# Patient Record
Sex: Male | Born: 1955 | Race: White | Hispanic: No | Marital: Single | Smoking: Former smoker
Health system: Southern US, Community
[De-identification: ages and names within clinical notes are randomized; demographics above are authoritative.]

## PROBLEM LIST (undated history)

## (undated) DIAGNOSIS — I219 Acute myocardial infarction, unspecified: Secondary | ICD-10-CM

## (undated) DIAGNOSIS — I10 Essential (primary) hypertension: Secondary | ICD-10-CM

## (undated) DIAGNOSIS — N400 Enlarged prostate without lower urinary tract symptoms: Secondary | ICD-10-CM

## (undated) DIAGNOSIS — E538 Deficiency of other specified B group vitamins: Secondary | ICD-10-CM

## (undated) DIAGNOSIS — E119 Type 2 diabetes mellitus without complications: Secondary | ICD-10-CM

## (undated) DIAGNOSIS — E785 Hyperlipidemia, unspecified: Secondary | ICD-10-CM

## (undated) DIAGNOSIS — I2510 Atherosclerotic heart disease of native coronary artery without angina pectoris: Secondary | ICD-10-CM

## (undated) HISTORY — PX: LEG SURGERY: SHX1003

## (undated) HISTORY — PX: SHOULDER SURGERY: SHX246

## (undated) HISTORY — DX: Essential (primary) hypertension: I10

## (undated) HISTORY — DX: Deficiency of other specified B group vitamins: E53.8

## (undated) HISTORY — DX: Hyperlipidemia, unspecified: E78.5

## (undated) HISTORY — DX: Atherosclerotic heart disease of native coronary artery without angina pectoris: I25.10

## (undated) HISTORY — DX: Benign prostatic hyperplasia without lower urinary tract symptoms: N40.0

## (undated) HISTORY — DX: Acute myocardial infarction, unspecified: I21.9

---

## 2002-08-29 HISTORY — PX: CORONARY STENT PLACEMENT: SHX1402

## 2002-09-18 ENCOUNTER — Inpatient Hospital Stay (HOSPITAL_COMMUNITY): Admission: EM | Admit: 2002-09-18 | Discharge: 2002-09-21 | Payer: Self-pay | Admitting: *Deleted

## 2002-09-18 ENCOUNTER — Encounter: Payer: Self-pay | Admitting: *Deleted

## 2004-08-29 ENCOUNTER — Ambulatory Visit: Payer: Self-pay | Admitting: Cardiology

## 2004-09-05 ENCOUNTER — Ambulatory Visit: Payer: Self-pay | Admitting: Cardiology

## 2004-09-05 ENCOUNTER — Ambulatory Visit: Payer: Self-pay

## 2004-10-26 ENCOUNTER — Ambulatory Visit: Payer: Self-pay | Admitting: Cardiology

## 2005-04-22 ENCOUNTER — Ambulatory Visit: Payer: Self-pay | Admitting: Cardiology

## 2005-04-29 ENCOUNTER — Ambulatory Visit: Payer: Self-pay | Admitting: Cardiology

## 2005-06-03 ENCOUNTER — Ambulatory Visit: Payer: Self-pay | Admitting: Cardiology

## 2005-08-14 ENCOUNTER — Ambulatory Visit: Payer: Self-pay | Admitting: Cardiology

## 2005-10-04 ENCOUNTER — Ambulatory Visit: Payer: Self-pay | Admitting: Cardiology

## 2006-06-04 ENCOUNTER — Ambulatory Visit: Payer: Self-pay | Admitting: Cardiology

## 2006-06-04 LAB — CONVERTED CEMR LAB
AST: 28 units/L (ref 0–37)
BUN: 15 mg/dL (ref 6–23)
Bilirubin, Direct: 0.2 mg/dL (ref 0.0–0.3)
CO2: 33 meq/L — ABNORMAL HIGH (ref 19–32)
Chloride: 105 meq/L (ref 96–112)
Cholesterol: 152 mg/dL (ref 0–200)
GFR calc Af Amer: 152 mL/min
GFR calc non Af Amer: 126 mL/min
HDL: 35.4 mg/dL — ABNORMAL LOW (ref >39.0)
Potassium: 3.7 meq/L (ref 3.5–5.1)
Sodium: 141 meq/L (ref 135–145)
VLDL: 16 mg/dL (ref 0–40)

## 2006-06-13 ENCOUNTER — Ambulatory Visit: Payer: Self-pay

## 2006-07-30 ENCOUNTER — Ambulatory Visit: Payer: Self-pay | Admitting: Cardiology

## 2006-07-30 LAB — CONVERTED CEMR LAB
ALT: 29 units/L (ref 0–53)
AST: 26 units/L (ref 0–37)
Bilirubin, Direct: 0.2 mg/dL (ref 0.0–0.3)
LDL Cholesterol: 87 mg/dL (ref 0–99)
Total Bilirubin: 1.2 mg/dL (ref 0.3–1.2)
Total Protein: 6.8 g/dL (ref 6.0–8.3)
Triglycerides: 97 mg/dL (ref 0–149)
VLDL: 19 mg/dL (ref 0–40)

## 2007-03-10 ENCOUNTER — Ambulatory Visit: Payer: Self-pay | Admitting: Cardiology

## 2007-03-12 ENCOUNTER — Ambulatory Visit: Payer: Self-pay | Admitting: Cardiology

## 2007-03-12 LAB — CONVERTED CEMR LAB
ALT: 37 units/L (ref 0–53)
Albumin: 3.8 g/dL (ref 3.5–5.2)
Alkaline Phosphatase: 101 units/L (ref 39–117)
BUN: 22 mg/dL (ref 6–23)
Bilirubin, Direct: 0.2 mg/dL (ref 0.0–0.3)
CO2: 31 meq/L (ref 19–32)
Calcium: 9.3 mg/dL (ref 8.4–10.5)
Cholesterol: 142 mg/dL (ref 0–200)
GFR calc Af Amer: 131 mL/min
GFR calc non Af Amer: 108 mL/min
LDL Cholesterol: 91 mg/dL (ref 0–99)
Sodium: 138 meq/L (ref 135–145)
Total CHOL/HDL Ratio: 5
Total Protein: 7 g/dL (ref 6.0–8.3)
VLDL: 22 mg/dL (ref 0–40)

## 2007-04-27 ENCOUNTER — Ambulatory Visit: Payer: Self-pay | Admitting: Cardiology

## 2007-04-27 LAB — CONVERTED CEMR LAB
Albumin: 3.7 g/dL (ref 3.5–5.2)
Alkaline Phosphatase: 110 units/L (ref 39–117)
Bilirubin, Direct: 0.2 mg/dL (ref 0.0–0.3)
HDL: 25.8 mg/dL — ABNORMAL LOW (ref >39.0)
Total Protein: 7.3 g/dL (ref 6.0–8.3)
VLDL: 17 mg/dL (ref 0–40)

## 2008-06-02 ENCOUNTER — Ambulatory Visit: Payer: Self-pay | Admitting: Cardiology

## 2008-06-02 DIAGNOSIS — R55 Syncope and collapse: Secondary | ICD-10-CM

## 2008-06-02 DIAGNOSIS — I2510 Atherosclerotic heart disease of native coronary artery without angina pectoris: Secondary | ICD-10-CM | POA: Insufficient documentation

## 2008-06-02 DIAGNOSIS — I10 Essential (primary) hypertension: Secondary | ICD-10-CM

## 2008-06-02 DIAGNOSIS — E782 Mixed hyperlipidemia: Secondary | ICD-10-CM | POA: Insufficient documentation

## 2008-06-03 LAB — CONVERTED CEMR LAB
ALT: 27 units/L (ref 0–53)
AST: 22 units/L (ref 0–37)
BUN: 23 mg/dL (ref 6–23)
Bilirubin, Direct: 0.2 mg/dL (ref 0.0–0.3)
Calcium: 9.2 mg/dL (ref 8.4–10.5)
Chloride: 106 meq/L (ref 96–112)
Creatinine, Ser: 0.6 mg/dL (ref 0.4–1.5)
Glucose, Bld: 159 mg/dL — ABNORMAL HIGH (ref 70–99)
HDL: 29.3 mg/dL — ABNORMAL LOW (ref >39.00)
LDL Cholesterol: 121 mg/dL — ABNORMAL HIGH (ref 0–99)
Potassium: 4.3 meq/L (ref 3.5–5.1)
Sodium: 142 meq/L (ref 135–145)
Total CHOL/HDL Ratio: 6
Triglycerides: 99 mg/dL (ref 0.0–149.0)

## 2008-06-13 ENCOUNTER — Telehealth (INDEPENDENT_AMBULATORY_CARE_PROVIDER_SITE_OTHER): Payer: Self-pay | Admitting: *Deleted

## 2008-06-14 ENCOUNTER — Encounter: Payer: Self-pay | Admitting: Cardiology

## 2008-06-14 ENCOUNTER — Ambulatory Visit: Payer: Self-pay

## 2008-06-15 ENCOUNTER — Encounter: Payer: Self-pay | Admitting: Cardiology

## 2008-07-15 ENCOUNTER — Encounter (INDEPENDENT_AMBULATORY_CARE_PROVIDER_SITE_OTHER): Payer: Self-pay | Admitting: *Deleted

## 2008-07-15 ENCOUNTER — Ambulatory Visit: Payer: Self-pay | Admitting: Cardiology

## 2008-07-15 LAB — CONVERTED CEMR LAB
AST: 27 units/L (ref 0–37)
Alkaline Phosphatase: 90 units/L (ref 39–117)
Bilirubin, Direct: 0.4 mg/dL — ABNORMAL HIGH (ref 0.0–0.3)
Cholesterol: 104 mg/dL (ref 0–200)
Total Bilirubin: 1.9 mg/dL — ABNORMAL HIGH (ref 0.3–1.2)
VLDL: 17.8 mg/dL (ref 0.0–40.0)

## 2009-04-11 ENCOUNTER — Inpatient Hospital Stay (HOSPITAL_COMMUNITY): Admission: EM | Admit: 2009-04-11 | Discharge: 2009-04-14 | Payer: Self-pay | Admitting: Emergency Medicine

## 2009-04-12 ENCOUNTER — Ambulatory Visit: Payer: Self-pay | Admitting: Internal Medicine

## 2009-04-13 ENCOUNTER — Encounter (INDEPENDENT_AMBULATORY_CARE_PROVIDER_SITE_OTHER): Payer: Self-pay

## 2009-04-13 ENCOUNTER — Encounter: Payer: Self-pay | Admitting: Internal Medicine

## 2009-04-14 ENCOUNTER — Encounter (INDEPENDENT_AMBULATORY_CARE_PROVIDER_SITE_OTHER): Payer: Self-pay | Admitting: *Deleted

## 2009-04-17 ENCOUNTER — Telehealth: Payer: Self-pay | Admitting: Internal Medicine

## 2009-04-17 ENCOUNTER — Ambulatory Visit: Payer: Self-pay | Admitting: Internal Medicine

## 2009-04-24 ENCOUNTER — Telehealth: Payer: Self-pay | Admitting: Internal Medicine

## 2009-04-28 ENCOUNTER — Ambulatory Visit: Payer: Self-pay | Admitting: Internal Medicine

## 2009-04-28 DIAGNOSIS — D510 Vitamin B12 deficiency anemia due to intrinsic factor deficiency: Secondary | ICD-10-CM | POA: Insufficient documentation

## 2009-05-01 LAB — CONVERTED CEMR LAB
Basophils Relative: 1.2 % (ref 0.0–3.0)
Eosinophils Relative: 1.8 % (ref 0.0–5.0)
MCV: 105.4 fL — ABNORMAL HIGH (ref 78.0–100.0)
Monocytes Absolute: 0.7 10*3/uL (ref 0.1–1.0)
Monocytes Relative: 7.9 % (ref 3.0–12.0)
Neutrophils Relative %: 66.3 % (ref 43.0–77.0)
RBC: 3.65 M/uL — ABNORMAL LOW (ref 4.22–5.81)
TSH: 2.59 microintl units/mL (ref 0.35–5.50)
Vitamin B-12: 1295 pg/mL — ABNORMAL HIGH (ref 211–911)
WBC: 8.2 10*3/uL (ref 4.5–10.5)

## 2009-06-29 ENCOUNTER — Ambulatory Visit: Payer: Self-pay | Admitting: Internal Medicine

## 2009-06-30 LAB — CONVERTED CEMR LAB
Basophils Relative: 0.8 % (ref 0.0–3.0)
Eosinophils Relative: 2 % (ref 0.0–5.0)
HCT: 45.3 % (ref 39.0–52.0)
Hemoglobin: 15.9 g/dL (ref 13.0–17.0)
Lymphocytes Relative: 23.9 % (ref 12.0–46.0)
Lymphs Abs: 2.1 10*3/uL (ref 0.7–4.0)
Monocytes Relative: 6.1 % (ref 3.0–12.0)
Neutro Abs: 5.9 10*3/uL (ref 1.4–7.7)
RBC: 5.03 M/uL (ref 4.22–5.81)
RDW: 13.5 % (ref 11.5–14.6)
WBC: 8.8 10*3/uL (ref 4.5–10.5)

## 2009-08-23 DIAGNOSIS — R7309 Other abnormal glucose: Secondary | ICD-10-CM | POA: Insufficient documentation

## 2009-08-23 DIAGNOSIS — E538 Deficiency of other specified B group vitamins: Secondary | ICD-10-CM | POA: Insufficient documentation

## 2009-08-23 DIAGNOSIS — E876 Hypokalemia: Secondary | ICD-10-CM

## 2009-08-25 ENCOUNTER — Ambulatory Visit: Payer: Self-pay | Admitting: Cardiology

## 2009-09-18 ENCOUNTER — Ambulatory Visit: Payer: Self-pay | Admitting: Cardiology

## 2009-09-20 LAB — CONVERTED CEMR LAB
ALT: 19 units/L (ref 0–53)
BUN: 14 mg/dL (ref 6–23)
Bilirubin, Direct: 0.2 mg/dL (ref 0.0–0.3)
Chloride: 107 meq/L (ref 96–112)
GFR calc non Af Amer: 148.44 mL/min (ref >60)
Glucose, Bld: 300 mg/dL — ABNORMAL HIGH (ref 70–99)
HDL: 27.2 mg/dL — ABNORMAL LOW (ref >39.00)
LDL Cholesterol: 85 mg/dL (ref 0–99)
Potassium: 4.8 meq/L (ref 3.5–5.1)
Sodium: 143 meq/L (ref 135–145)
Total Bilirubin: 0.9 mg/dL (ref 0.3–1.2)
VLDL: 33.6 mg/dL (ref 0.0–40.0)

## 2009-09-25 ENCOUNTER — Ambulatory Visit: Payer: Self-pay | Admitting: Cardiology

## 2009-09-29 LAB — CONVERTED CEMR LAB
ALT: 22 units/L (ref 0–53)
AST: 18 units/L (ref 0–37)
Albumin: 4 g/dL (ref 3.5–5.2)
Alkaline Phosphatase: 169 units/L — ABNORMAL HIGH (ref 39–117)
GGT: 20 units/L (ref 7–51)
Total Bilirubin: 1.1 mg/dL (ref 0.3–1.2)

## 2010-02-27 NOTE — Procedures (Signed)
Summary: Upper Endoscopy  Patient: [name] Note: All result statuses are Final unless otherwise noted.  Tests: (1) Upper Endoscopy (EGD)   EGD Upper Endoscopy       DONE     [place] [facility]     [address]     [place], [place]  [address]           ENDOSCOPY PROCEDURE REPORT           PATIENT:  Nicholas Fields, Nicholas Fields  MR#:  [id number]     BIRTHDATE:  1955-12-29, 53 yrs. old  GENDER:  male           ENDOSCOPIST:  Iva Boop, MD, [facility]     Referred by:  [facility],           PROCEDURE DATE:  04/13/2009     PROCEDURE:  EGD with biopsy     ASA CLASS:  Class III     INDICATIONS:  anemia, weight loss vitamin B12 deficiency     change in bowels (loose-diarrhea)           MEDICATIONS:   Fentanyl 50 mcg IV, Versed 5 mg IV     TOPICAL ANESTHETIC:  Cetacaine Spray           DESCRIPTION OF PROCEDURE:   After the risks benefits and     alternatives of the procedure were thoroughly explained, informed     consent was obtained.  The [id number] ([id number]) endoscope was     introduced through the mouth and advanced to the second portion of     the duodenum, without limitations.  The instrument was slowly     withdrawn as the mucosa was fully examined.     <<PROCEDUREIMAGES>>           The esophagus and gastroesophageal junction were completely normal     in appearance.  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     Multiple biopsies were obtained and sent to pathology.  Abnormal     appearing mucosa in the second portion of the duodenum. Somewhat     atrophic. Multiple biopsies were obtained and sent to pathology.     Retroflexed views revealed Retroflexion exam demonstrated findings     as previously described.    The scope was then withdrawn from the     patient and the procedure completed.           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           Iva Boop, MD, [facility]           CC:  Windle Guard, MD, The Patient           n.     eSIGNED:   Iva Boop at 04/13/2009 02:55 PM           Lethea Killings, [id number]  Note: An exclamation mark (!) indicates a result that was not dispersed into  the flowsheet. Document Creation Date: 04/13/2009 2:56 PM _______________________________________________________________________  (1) Order result status: Final Collection or observation date-time: 04/13/2009 14:15 Requested date-time:  Receipt date-time:  Reported date-time:  Referring Physician:   Ordering Physician: Stan Head [id number]) Specimen Source:  Source: Launa Grill Order Number: [id number] Lab site:

## 2010-02-27 NOTE — Progress Notes (Signed)
Summary: appt ?   Phone Note Call from Patient Call back at Home Phone [phone]   Caller: Patient Call For: Dr. Leone Payor Reason for Call: Talk to Nurse Summary of Call: pt thinks that Dr. Leone Payor wanted his hosp f/u appt sooner than 05/18/2009 Initial call taken by: Vallarie Mare,  April 24, 2009 9:19 AM  Follow-up for Phone Call        he is correct cand add him on 4/1 ?330 PM and remove other appt Iva Boop MD, [facility]  April 24, 2009 1:17 PM   Additional Follow-up for Phone Call Additional follow up Details #1::        Pt. ntfd. that appt. is changed to this Friday at 3:30pm Additional Follow-up by: Teryl Lucy RN,  April 24, 2009 2:46 PM

## 2010-02-27 NOTE — Letter (Signed)
Summary: Back to Work  [facility]  [address] [place], [place] [address]   Phone: [phone]  Fax: [phone]    April 28, 2009   Employee:  SATURNINO LIEW    To Whom It May Concern:   Mr. Aguila is cleared to return to work  without restrictions as of May 01, 2009.    If you need additional information, please feel free to contact our office.         Sincerely,    Iva Boop MD, [facility]

## 2010-02-27 NOTE — Procedures (Signed)
Summary: Colonoscopy  Patient: Nicholas Fields Note: All result statuses are Final unless otherwise noted.  Tests: (1) Colonoscopy (COL)   COL Colonoscopy           DONE     [place] [facility]     [address]     [place], [place]  [address]           COLONOSCOPY PROCEDURE REPORT           PATIENT:  Nicholas Fields, Nicholas Fields  MR#:  [id number]     BIRTHDATE:  [birth year], 53 yrs. old  GENDER:  male           ENDOSCOPIST:  Iva Boop, MD, [facility]     Referred by:  [facility],           PROCEDURE DATE:  04/13/2009     PROCEDURE:  Colonoscopy with biopsy and snare polypectomy     ASA CLASS:  Class III     INDICATIONS:  weight loss, change in bowel habits           MEDICATIONS:   There was residual sedation effect present from     prior procedure., Fentanyl 25 mcg IV, Versed 2 mg IV           DESCRIPTION OF PROCEDURE:   After the risks benefits and     alternatives of the procedure were thoroughly explained, informed     consent was obtained.  Digital rectal exam was performed and     revealed an enlarged prostate.  Diffusely enlarged. The [id number]     ([id number]) endoscope was introduced through the anus and advanced     to the terminal ileum which was intubated for a short distance,     without limitations.  The quality of the prep was adequate, using     MiraLax.  The instrument was then slowly withdrawn as the colon     was fully examined.     Insertion: 4:45 minutes Withdrawal: 15 minutes     <<PROCEDUREIMAGES>>           FINDINGS:  The terminal ileum appeared normal.  Two polyps were     found in the right colon. 3mm cecal polyp and a 4 mm ascending     polyp. The polyp was removed using cold biopsy forceps. Polyp was     snared without cautery. Retrieval was unsuccessful. snare polyp     This was otherwise a normal examination of the colon. Random     biopsies were obtained and sent to pathology.   Retroflexed views     in the rectum revealed no abnormalities.    The  scope was then     withdrawn from the patient and the procedure completed.           COMPLICATIONS:  None           ENDOSCOPIC IMPRESSION:     1) Normal terminal ileum     2) Two diminutive polyps in the right colon - removed, 1     recovered 1 not recovered     3) Otherwise normal examination - random biopsies taken           RECOMMENDATIONS:     1) Await biopsy results     see EGD report     have follow-up on enlarged prostate with PCP or GU           I will arrange outpatient follow-up with me after  discharge           REPEAT EXAM:  In for Colonoscopy, pending biopsy results.           Iva Boop, MD, [facility]           CC:  Windle Guard, MD     The Patient           n.     eSIGNED:   Iva Boop at 04/13/2009 03:03 PM           Lethea Killings, [id number]  Note: An exclamation mark (!) indicates a result that was not dispersed into the flowsheet. Document Creation Date: 04/13/2009 3:04 PM _______________________________________________________________________  (1) Order result status: Final Collection or observation date-time: 04/13/2009 14:56 Requested date-time:  Receipt date-time:  Reported date-time:  Referring Physician:   Ordering Physician: Stan Head [id number]) Specimen Source:  Source: Launa Grill Order Number: [id number] Lab site:

## 2010-02-27 NOTE — Assessment & Plan Note (Signed)
Summary: B12 - daily x5 then weekly- #1/sheri  Nurse Visit   Allergies: No Known Drug Allergies  Medication Administration  Injection # 1:    Medication: Vit B12 1000 mcg    Diagnosis: ANEMIA, VITAMIN B12 DEFICIENCY (ICD-281.1)    Route: IM    Site: R deltoid    Exp Date: 11/12    Lot #: [id number]    Mfr: [employer] Regent    Patient tolerated injection without complications    Given by: Milford Cage NCMA (April 17, 2009 2:35 PM)  Orders Added: 1)  Vit B12 1000 mcg [J3420]

## 2010-02-27 NOTE — Assessment & Plan Note (Signed)
Summary: 1 year reck/mt  Medications Added ATENOLOL 50 MG TABS (ATENOLOL) Take 1 tablet by mouth two times a day LISINOPRIL 40 MG TABS (LISINOPRIL) Take one tablet by mouth daily        CC:  check up....pt states he has not been taken any of his meds.  History of Present Illness: The patient is a very pleasant  gentleman who is status post PCI of his right coronary in August 2004.  His most recent Myoview in May 2010 showed normal perfusion.  Ejection fraction was 65%. Echocardiogram in May of 2010 showed normal LV function and a bicuspid aortic valve. There was no aortic stenosis or aortic insufficiency. Since I last saw him in May of 2010 he is doing reasonably well symptomatically. He denies any dyspnea on exertion, orthopnea, PND, chest pain or palpitations. Note he has not taken any of his medications in the past month. He has also been diagnosed with pernicious anemia.  Current Medications (verified): 1)  Atenolol 50 Mg Tabs (Atenolol) .... Take 2 Tablets By Mouth Two Times A Day 2)  Aspirin 325 Mg  Tabs (Aspirin) .... Daily 3)  Cyanocobalamin 1000 Mcg/ml Soln (Cyanocobalamin) .Marland Kitchen.. 1000 Micrograms Im Once Monthly 4)  Altace 10 Mg Caps (Ramipril) .... Take 1 Tablet By Mouth Two Times A Day 5)  Amitriptyline Hcl 25 Mg Tabs (Amitriptyline Hcl) .... Daily 6)  Hydrochlorothiazide 12.5 Mg Tabs (Hydrochlorothiazide) .... Daily 7)  Lipitor 80 Mg Tabs (Atorvastatin Calcium) .... Daily 8)  Bd Luer-Lok Syringe 25g X 1" 3 Ml Misc (Syringe/needle (Disp)) .... Use As Directed  Allergies: No Known Drug Allergies  Past History:  Past Medical History: HYPERTENSION, BENIGN MIXED HYPERLIPIDEMIA  CAD  B12 Deficiency - Pernicious anemia Status post PCI of the right coronary artery drug-eluting stent in 2004.  Past Surgical History: Reviewed history from 04/28/2009 and no changes required. Surgery for a previous leg fracture. PTCA-Stent  Social History: Reviewed history from 06/29/2009 and  no changes required. Single  [employer] Tobacco Use - Former.  Alcohol Use - no Illicit Drug Use - no  Review of Systems       no fevers or chills, productive cough, hemoptysis, dysphasia, odynophagia, melena, hematochezia, dysuria, hematuria, rash, seizure activity, orthopnea, PND, pedal edema, claudication. Remaining systems are negative.   Vital Signs:  Patient profile:   55 year old male Height:      62 inches Weight:      148 pounds BMI:     27.17 Pulse rate:   100 / minute Resp:     14 per minute BP sitting:   158 / 94  (right arm)  Vitals Entered By: Kem Parkinson (August 25, 2009 8:39 AM)  Physical Exam  General:  Well-developed well-nourished in no acute distress.  Skin is warm and dry.  HEENT is normal.  Neck is supple. No thyromegaly.  Chest is clear to auscultation with normal expansion.  Cardiovascular exam is regular rate and rhythm.  Abdominal exam nontender or distended. No masses palpated. Extremities show no edema. neuro grossly intact    EKG  Procedure date:  08/25/2009  Findings:      sinus at a rate of 99. Axis normal. Cannot rule out prior inferior infarct. Occasional PVC.  Impression & Recommendations:  Problem # 1:  HYPERTENSION, BENIGN (ICD-401.1) Patient's blood pressure is elevated but he has not taken any medications in one month. Presumed atenolol 50 mg p.o. b.i.d. Add lisinopril 40 mg p.o. daily.  Follow blood pressure at home and add additional medications as needed.Check potassium and renal function in 4 weeks. His updated medication list for this problem includes:    Atenolol 50 Mg Tabs (Atenolol) .Marland Kitchen... Take 2 tablets by mouth two times a day    Aspirin 325 Mg Tabs (Aspirin) .Marland Kitchen... Daily    Altace 10 Mg Caps (Ramipril) .Marland Kitchen... Take 1 tablet by mouth two times a day    Hydrochlorothiazide 12.5 Mg Tabs (Hydrochlorothiazide) .Marland Kitchen... Daily  Problem # 2:  MIXED HYPERLIPIDEMIA (ICD-272.2)  Resume Lipitor 80 mg p.o. daily.  Check lipids and liver in 4 weeks. His updated medication list for this problem includes:    Lipitor 80 Mg Tabs (Atorvastatin calcium) .Marland Kitchen... Daily  His updated medication list for this problem includes:    Lipitor 80 Mg Tabs (Atorvastatin calcium) .Marland Kitchen... Daily  Problem # 3:  CAD (ICD-414.00)  Continue aspirin, beta blocker, ACE inhibitor and statin. His updated medication list for this problem includes:    Atenolol 50 Mg Tabs (Atenolol) .Marland Kitchen... Take 1 tablet by mouth two times a day    Aspirin 325 Mg Tabs (Aspirin) .Marland Kitchen... Daily    Lisinopril 40 Mg Tabs (Lisinopril) .Marland Kitchen... Take one tablet by mouth daily  His updated medication list for this problem includes:    Atenolol 50 Mg Tabs (Atenolol) .Marland Kitchen... Take 1 tablet by mouth two times a day    Aspirin 325 Mg Tabs (Aspirin) .Marland Kitchen... Daily    Lisinopril 40 Mg Tabs (Lisinopril) .Marland Kitchen... Take one tablet by mouth daily  Problem # 4:  B12 DEFICIENCY (ICD-266.2)  Patient Instructions: 1)  Your physician recommends that you schedule a follow-up appointment in: ONE YEAR 2)  Your physician recommends that you return for lab work in:4 WEEKS-WEEK OF 09-18-09 3)  Your physician has recommended you make the following change in your medication: RESTART ATENOLOL 50MG  ONE TABLET TWICE DAILY 4)  RESTART LIPITOR 80MG  ONCE DAILY 5)  START LISINOPRIL 40MG  ONCE DAILY Prescriptions: LIPITOR 80 MG TABS (ATORVASTATIN CALCIUM) daily  #30 x 12   Entered by:   Deliah Goody, RN   Authorized by:   Ferman Hamming, MD, [facility]   Signed by:   Deliah Goody, RN on 08/25/2009   Method used:   Electronically to        [facility]  [address] [id number]       [address]       [place], [place]  [address]       Ph: [phone] or [phone]       Fax: [phone]   RxID:   [id number] LISINOPRIL 40 MG TABS (LISINOPRIL) Take one tablet by mouth daily  #30 x 12   Entered by:   Deliah Goody, RN   Authorized by:   Ferman Hamming, MD, [facility]    Signed by:   Deliah Goody, RN on 08/25/2009   Method used:   Electronically to        [facility]  [address] [id number]       [address]       [place], [place]  [address]       Ph: [phone] or [phone]       Fax: [phone]   RxID:   [id number] ATENOLOL 50 MG TABS (ATENOLOL) Take 1 tablet by mouth two times a day  #60 x 12   Entered by:   Deliah Goody, RN  Authorized by:   Ferman Hamming, MD, [facility]   Signed by:   Deliah Goody, RN on 08/25/2009   Method used:   [facility]ronically [facility]        [facility]  [address] [id number]       [address]       [place], [place]  [address]       Ph: [phone] or [phone]       Fax: [phone]   RxID:   [id number]

## 2010-02-27 NOTE — Letter (Signed)
Summary: New Patient letter  [facility]  [address] [place], [place] [address]   Phone: [phone]  Fax: [phone]       04/14/2009 MRN: [id number]  Nicholas Fields [address] [place], [place]  [address]  Dear Nicholas Fields,  Welcome to the Gastroenterology Division at [facility].    You are scheduled to see Dr.  Leone Payor on 05-18-09 at 8:45a.m. on the 3rd floor at [facility], [address]  We ask that you try to arrive at our office 15 minutes prior to your appointment time to allow for check-in.  We would like you to complete the enclosed self-administered evaluation form prior to your visit and bring it with you on the day of your appointment.  We will review it with you.  Also, please bring a complete list of all your medications or, if you prefer, bring the medication bottles and we will list them.  Please bring your insurance card so that we may make a copy of it.  If your insurance requires a referral to see a specialist, please bring your referral form from your primary care physician.  Co-payments are due at the time of your visit and may be paid by cash, check or credit card.     Your office visit will consist of a consult with your physician (includes a physical exam), any laboratory testing he/she may order, scheduling of any necessary diagnostic testing (e.g. x-ray, ultrasound, CT-scan), and scheduling of a procedure (e.g. Endoscopy, Colonoscopy) if required.  Please allow enough time on your schedule to allow for any/all of these possibilities.    If you cannot keep your appointment, please call [phone] to cancel or reschedule prior to your appointment date.  This allows [place] the opportunity to schedule an appointment for another patient in need of care.  If you do not cancel or reschedule by 5 p.m. the business day prior to your appointment date, you will be charged a $50.00 late cancellation/no-show fee.    Thank you for choosing  [facility] for your medical needs.  We appreciate the opportunity to care for you.  Please visit [place] at our website  to learn more about our practice.                     Sincerely,                                                             The Gastroenterology Division

## 2010-02-27 NOTE — Assessment & Plan Note (Signed)
Summary: 6 WEEK F/U..AM.    History of Present Illness Visit Type: Follow-up Visit Primary GI MD: Stan Head MD [facility] Chief Complaint: anemia f/u. History of Present Illness:   55 yo wm with pernicious anemia dianosed in Spring 2011. He has been started on B12 supplements, receiving 1000ug injection weekly (at home). He feels back to normal. Has regained some biut notall weight he lost.           Current Medications (verified): 1)  Atenolol 50 Mg Tabs (Atenolol) .... Take 2 Tablets By Mouth Two Times A Day 2)  Aspirin 325 Mg  Tabs (Aspirin) .... Daily 3)  Cyanocobalamin 1000 Mcg/ml Soln (Cyanocobalamin) .Marland Kitchen.. 1000 Micrograms Im Q Day X 4 Then Weekly X 4 Then [place]. Please Dispense #20 25 Guage 1 1/2 Inch Needles and 3 Cc Syringes 4)  Altace 10 Mg Caps (Ramipril) .... Take 1 Tablet By Mouth Two Times A Day 5)  Amitriptyline Hcl 25 Mg Tabs (Amitriptyline Hcl) .... Daily 6)  Hydrochlorothiazide 12.5 Mg Tabs (Hydrochlorothiazide) .... Daily 7)  Lipitor 80 Mg Tabs (Atorvastatin Calcium) .... Daily 8)  Bd Luer-Lok Syringe 25g X 1" 3 Ml Misc (Syringe/needle (Disp)) .... Use As Directed  Allergies (verified): No Known Drug Allergies  Past History:  Past Medical History: Reviewed history from 04/28/2009 and no changes required. Coronary artery disease Hypertension Hyperlipidemia Status post PCI of the right coronary artery drug-eluting stent in 2004. B12 Deficiency - Pernicious anemia  Past Surgical History: Reviewed history from 04/28/2009 and no changes required. Surgery for a previous leg fracture. PTCA-Stent  Family History: Reviewed history from 04/28/2009 and no changes required. Mother with history of coronary artery disease. No FH of Colon Cancer:  Social History: Single  [employer] Tobacco Use - Former.  Alcohol Use - no Illicit Drug Use - no  Vital Signs:  Patient profile:   55 year old male Height:      62.5 inches Weight:      148.38  pounds BMI:     26.80 Pulse rate:   76 / minute Pulse rhythm:   regular BP sitting:   158 / 80  (left arm)  Vitals Entered By: Milford Cage NCMA (June 29, 2009 4:09 PM)  Physical Exam  General:  Well developed, well nourished, no acute distress.   Impression & Recommendations:  Problem # 1:  PERNICIOUS ANEMIA (ICD-281.0) Assessment Improved He is much betternow on B12 supplements. + anti-parietal cell and intrinsic factor antibosies. celiac testing and biospies were negative. Orders: TLB-CBC Platelet - w/Differential ([id number])  Plan for him to follow-up with Dr. Jeannetta Nap and receive B12 injection refills through that office in the future and see me as needed  Patient Instructions: 1)  Please go to the basement to have your lab tests drawn today. 2)  Take the vitamin B12 1 time a month. Do not change unless your physician says to. 3)  Follow-up with Dr. Jeannetta Nap in the future for general care and B12 prescriptions. 4)  see Gr. Leone Payor as needed. 5)  Copy sent to : Windle Guard, MD 6)  The medication list was reviewed and reconciled.  All changed / newly prescribed medications were explained.  A complete medication list was provided to the patient / caregiver.  Patient: Nicholas Fields Note: All result statuses are Final unless otherwise noted.  Tests: (1) CBC Platelet w/Diff (CBCD)   White Cell Count          8.8 K/uL  4.5-10.5   Red Cell Count            5.03 Mil/uL                 4.22-5.81   Hemoglobin                15.9 g/dL                   16.1-09.6   Hematocrit                45.3 %                      39.0-52.0   MCV                       90.1 fl                     78.0-100.0   MCHC                      35.0 g/dL                   04.5-40.9   RDW                       13.5 %                      11.5-14.6   Platelet Count            197.0 K/uL                  150.0-400.0   Neutrophil %              67.2 %                      43.0-77.0    Lymphocyte %              23.9 %                      12.0-46.0   Monocyte %                6.1 %                       3.0-12.0   Eosinophils%              2.0 %                       0.0-5.0   Basophils %               0.8 %                       0.0-3.0   Neutrophill Absolute      5.9 K/uL                    1.4-7.7   Lymphocyte Absolute       2.1 K/uL                    0.7-4.0   Monocyte Absolute         0.5 K/uL  0.1-1.0  Eosinophils, Absolute                             0.2 K/uL                    0.0-0.7   Basophils Absolute        0.1 K/uL                    0.0-0.1  Note: An exclamation mark (!) indicates a result that was not dispersed into the flowsheet. Document Creation Date: 06/29/2009 5:15 PM

## 2010-02-27 NOTE — Progress Notes (Signed)
Summary: Triage  Medications Added CYANOCOBALAMIN 1000 MCG/ML SOLN (CYANOCOBALAMIN) 1000 micrograms IM q day x 4 then weekly x 4 then montly. Please dispense #20 25 guage 1 1/2 inch needles and 3 cc syringes       Phone Note Call from Patient Call back at [phone]   Caller: sister  Alona Bene    Call For: Dr. Leone Payor Reason for Call: Talk to Nurse Summary of Call: Pt has a hosp. f/u appt. on 05-18-09. Needs to sch'd B-12 injections for this week and needs to know when he should return to work. Initial call taken by: Karna Christmas,  April 17, 2009 8:49 AM  Follow-up for Phone Call        Patient  will come today at 1:40 for b12 injection #1 , please advise when he may return to work. Follow-up by: Darcey Nora RN, CGRN,  April 17, 2009 10:37 AM  Additional Follow-up for Phone Call Additional follow up Details #1::        what is his job, duties, functions, etc Iva Boop MD, [facility]  April 17, 2009 12:52 PM     Additional Follow-up for Phone Call Additional follow up Details #2::    He is a fabricator in a sheet metal shop , he works 40 hours a week.  Pleae advise when he can return to work.  his daughter is here with him today and wants to give him his injections for B12.   Follow-up by: Darcey Nora RN, CGRN,  April 17, 2009 2:10 PM  Additional Follow-up for Phone Call Additional follow up Details #3:: Details for Additional Follow-up Action Taken: he should not return to work til we reassess. I will look over his data again and make a recommendation soo re: f/u he will need to be seen sooner than 4/21 and I think perhaps extender visit next week may be way to go will let you know but communicate this to him and/or sister Additional Follow-up by: Iva Boop MD, Clementeen Graham,  April 18, 2009 7:24 AM  New/Updated Medications: CYANOCOBALAMIN 1000 MCG/ML SOLN (CYANOCOBALAMIN) 1000 micrograms IM q day x 4 then weekly x 4 then montly. Please dispense #20 25 guage 1 1/2 inch needles and 3 cc  syringes   Sister notified of Dr Marvell Fuller recommendations.  We will call her once Dr Leone Payor has had a chance to fully review   Darcey Nora RN, [facility]  April 18, 2009 8:48 AM     Prescriptions: CYANOCOBALAMIN 1000 MCG/ML SOLN (CYANOCOBALAMIN) 1000 micrograms IM q day x 4 then weekly x 4 then montly. Please dispense #20 25 guage 1 1/2 inch needles and 3 cc syringes  #20 doses x 1   Entered by:   Darcey Nora RN, CGRN   Authorized by:   Iva Boop MD, [facility]   Signed by:   Darcey Nora RN, CGRN on 04/17/2009   Method used:   Electronically to        [facility]  [address] [id number] (ret[facility])       [address]       [place], [place]  [address]       Ph: [phone] or [phone]       Fax: [phone]   RxID:   [id number]

## 2010-02-27 NOTE — Assessment & Plan Note (Signed)
Summary: f/u hosp. anemia--ch. 3:30pm appt.      Allergies Added: NKDA  History of Present Illness Visit Type: Follow-up Visit Primary GI MD: Stan Head MD [facility] Chief Complaint: follow-up anemia History of Present Illness:   55 yo white man seen last month for weight loss, bowel habit changes, anemia  and anorexia. He was found to be B12 deficient with atrophic gastitis and + anti-parietal and anti-intrinsic factor Abs. He has been getting B12 supplements daily and had first weekly B12 injection. Energy level increasing ready to go back to work getting back to normal   GI Review of Systems      Denies abdominal pain, acid reflux, belching, bloating, chest pain, dysphagia with liquids, dysphagia with solids, heartburn, loss of appetite, nausea, vomiting, vomiting blood, weight loss, and  weight gain.        Denies anal fissure, black tarry stools, change in bowel habit, constipation, diarrhea, diverticulosis, fecal incontinence, heme positive stool, hemorrhoids, irritable bowel syndrome, jaundice, light color stool, liver problems, rectal bleeding, and  rectal pain. Preventive Screening-Counseling & Management      Drug Use:  no.      Current Medications (verified): 1)  Atenolol 50 Mg Tabs (Atenolol) .... Take 2 Tablets By Mouth Two Times A Day 2)  Aspirin 325 Mg  Tabs (Aspirin) .... Daily 3)  Cyanocobalamin 1000 Mcg/ml Soln (Cyanocobalamin) .Marland Kitchen.. 1000 Micrograms Im Q Day X 4 Then Weekly X 4 Then Montly. Please Dispense #20 25 Guage 1 1/2 Inch Needles and 3 Cc Syringes 4)  Altace 10 Mg Caps (Ramipril) .... Take 1 Tablet By Mouth Two Times A Day 5)  Amitriptyline Hcl 25 Mg Tabs (Amitriptyline Hcl) .... Daily 6)  Hydrochlorothiazide 12.5 Mg Tabs (Hydrochlorothiazide) .... Daily 7)  Lipitor 80 Mg Tabs (Atorvastatin Calcium) .... Daily  Allergies (verified): No Known Drug Allergies  Past History:  Past Medical History: Coronary artery  disease Hypertension Hyperlipidemia Status post PCI of the right coronary artery drug-eluting stent in 2004. B12 Deficiency - Pernicious anemia  Past Surgical History: Reviewed history from 04/28/2009 and no changes required. Surgery for a previous leg fracture. PTCA-Stent  Family History: Reviewed history from 06/02/2008 and no changes required. Mother with history of coronary artery disease. No FH of Colon Cancer:  Social History: Reviewed history from 06/02/2008 and no changes required. Single  Tobacco Use - Former.  Alcohol Use - no Illicit Drug Use - no Drug Use:  no  Colonoscopy  Procedure date:  04/13/2009  Findings:      1) Normal terminal ileum     2) Two diminutive polyps in the right colon - removed, 1     recovered 1 not recovered benign mucosa on path     3) Otherwise normal examination - random biopsies taken  Comments:       Repeat colonoscopy in 10 years.    EGD  Procedure date:  04/13/2009  Findings:      1) Normal esophagus     2) Atrophic gastric mucosa in the total stomach - biopsied MILD GASTRITIS     3) Abnormal mucosa in the second portion duodenum NORMAL  Procedures Next Due Date:    EGD: 04/2019    Colonoscopy: 04/2019  EGD not needed, this is an error  Vital Signs:  Patient profile:   55 year old male Height:      62.5 inches Weight:      141.38 pounds BMI:     25.54 Pulse rate:   72 /  minute Pulse rhythm:   regular BP sitting:   152 / 88  (left arm)  Vitals Entered By: Milford Cage NCMA (April 28, 2009 2:58 PM)  Physical Exam  General:  Well developed, well nourished, no acute distress. Lungs:  Clear throughout to auscultation. Heart:  Regular rate and rhythm; no murmurs, rubs,  or bruits. Abdomen:  mildly distended, softNT   Impression & Recommendations:  Problem # 1:  PERNICIOUS ANEMIA (ICD-281.0) Assessment Improved He is much betternow on B12 supplements. + anti-parietal cell and intrinsic factor  antibosies. celiac testing and biospies were negative. Orders: TLB-CBC Platelet - w/Differential (85025-CBCD) TLB-B12, Serum-Total ONLY (10272-Z36) TLB-TSH (Thyroid Stimulating Hormone) (84443-TSH) 15 mins time spent with patienttoday.  Patient Instructions: 1)  Please go to the basement to have your lab tests drawn today. 2)  We are checking vitamin B12, Complete blood count and thyroid tests today. 3)  Follow-up will be arranged after lab results are reviewed. 4)  You may return to work. 5)  The medication list was reviewed and reconciled.  All changed / newly prescribed medications were explained.  A complete medication list was provided to the patient / caregiver. cc: Windle Guard, MD Patient: Lethea Killings Note: All result statuses are Final unless otherwise noted.  Tests: (1) CBC Platelet w/Diff (CBCD)   White Cell Count          8.2 K/uL                    4.5-10.5   Red Cell Count       [L]  3.65 Mil/uL                 4.22-5.81   Hemoglobin           [L]  12.8 g/dL                   64.4-03.4   Hematocrit           [L]  38.5 %                      39.0-52.0   MCV                  [H]  105.4 fl                    78.0-100.0   MCHC                      33.2 g/dL                   74.2-59.5   RDW                  [H]  23.7 %                      11.5-14.6   Platelet Count            280.0 K/uL                  150.0-400.0   Neutrophil %              66.3 %                      43.0-77.0   Lymphocyte %              22.8 %  12.0-46.0   Monocyte %                7.9 %                       3.0-12.0   Eosinophils%              1.8 %                       0.0-5.0   Basophils %               1.2 %                       0.0-3.0   Neutrophill Absolute      5.5 K/uL                    1.4-7.7   Lymphocyte Absolute       1.9 K/uL                    0.7-4.0   Monocyte Absolute         0.7 K/uL                    0.1-1.0  Eosinophils, Absolute                              0.1 K/uL                    0.0-0.7   Basophils Absolute        0.1 K/uL                    0.0-0.1  Tests: (2) B12 Serum - Total ONLY (B12)   Vitamin B12          [H]  1295 pg/mL                  211-911  Tests: (3) TSH (TSH)   FastTSH                   2.59 uIU/mL                 0.35-5.50

## 2010-04-22 LAB — GLIADIN ANTIBODIES, SERUM
Gliadin IgA: 2.8 U/mL (ref <7)
Gliadin IgG: 9.3 U/mL — ABNORMAL HIGH (ref <7)

## 2010-04-22 LAB — COMPREHENSIVE METABOLIC PANEL
AST: 24 U/L (ref 0–37)
AST: 27 U/L (ref 0–37)
Albumin: 3.7 g/dL (ref 3.5–5.2)
Albumin: 4.2 g/dL (ref 3.5–5.2)
CO2: 28 mEq/L (ref 19–32)
Calcium: 8.8 mg/dL (ref 8.4–10.5)
Creatinine, Ser: 0.53 mg/dL (ref 0.4–1.5)
GFR calc Af Amer: >60 mL/min (ref >60)
GFR calc non Af Amer: >60 mL/min (ref >60)
GFR calc non Af Amer: >60 mL/min (ref >60)
Glucose, Bld: 204 mg/dL — ABNORMAL HIGH (ref 70–99)
Potassium: 4.6 mEq/L (ref 3.5–5.1)
Sodium: 138 mEq/L (ref 135–145)
Total Bilirubin: 2.9 mg/dL — ABNORMAL HIGH (ref 0.3–1.2)
Total Protein: 5.8 g/dL — ABNORMAL LOW (ref 6.0–8.3)

## 2010-04-22 LAB — CARDIAC PANEL(CRET KIN+CKTOT+MB+TROPI)
CK, MB: 1.2 ng/mL (ref 0.3–4.0)
CK, MB: 1.5 ng/mL (ref 0.3–4.0)
Relative Index: INVALID (ref 0.0–2.5)
Relative Index: INVALID (ref 0.0–2.5)
Total CK: 18 U/L (ref 7–232)
Total CK: 24 U/L (ref 7–232)
Troponin I: <0.01 ng/mL (ref 0.00–0.06)

## 2010-04-22 LAB — RETICULIN ANTIBODIES, IGA W TITER: Reticulin Ab, IgA: NEGATIVE

## 2010-04-22 LAB — CBC
Hemoglobin: 7.5 g/dL — ABNORMAL LOW (ref 13.0–17.0)
Hemoglobin: 8.1 g/dL — ABNORMAL LOW (ref 13.0–17.0)
MCHC: 34.9 g/dL (ref 30.0–36.0)
MCV: 113.7 fL — ABNORMAL HIGH (ref 78.0–100.0)
Platelets: 148 10*3/uL — ABNORMAL LOW (ref 150–400)
Platelets: 173 10*3/uL (ref 150–400)
RBC: 1.6 MIL/uL — ABNORMAL LOW (ref 4.22–5.81)
RDW: 21.3 % — ABNORMAL HIGH (ref 11.5–15.5)
RDW: 32.6 % — ABNORMAL HIGH (ref 11.5–15.5)
RDW: 33.6 % — ABNORMAL HIGH (ref 11.5–15.5)
WBC: 6.8 10*3/uL (ref 4.0–10.5)

## 2010-04-22 LAB — FERRITIN: Ferritin: 194 ng/mL (ref 22–322)

## 2010-04-22 LAB — DIFFERENTIAL
Basophils Absolute: 0 10*3/uL (ref 0.0–0.1)
Basophils Relative: 0 % (ref 0–1)
Eosinophils Absolute: 0.1 10*3/uL (ref 0.0–0.7)
Lymphocytes Relative: 11 % — ABNORMAL LOW (ref 12–46)
Monocytes Relative: 2 % — ABNORMAL LOW (ref 3–12)
Neutro Abs: 5.9 10*3/uL (ref 1.7–7.7)
Neutrophils Relative %: 86 % — ABNORMAL HIGH (ref 43–77)

## 2010-04-22 LAB — CROSSMATCH

## 2010-04-22 LAB — SEDIMENTATION RATE: Sed Rate: 14 mm/hr (ref 0–16)

## 2010-04-22 LAB — BASIC METABOLIC PANEL
Calcium: 8.5 mg/dL (ref 8.4–10.5)
GFR calc non Af Amer: >60 mL/min (ref >60)
Glucose, Bld: 122 mg/dL — ABNORMAL HIGH (ref 70–99)
Sodium: 139 mEq/L (ref 135–145)

## 2010-04-22 LAB — POCT CARDIAC MARKERS
CKMB, poc: <1 ng/mL — ABNORMAL LOW (ref 1.0–8.0)
Myoglobin, poc: 74.4 ng/mL (ref 12–200)
Troponin i, poc: <0.05 ng/mL (ref 0.00–0.09)

## 2010-04-22 LAB — GLUCOSE, CAPILLARY
Glucose-Capillary: 107 mg/dL — ABNORMAL HIGH (ref 70–99)
Glucose-Capillary: 188 mg/dL — ABNORMAL HIGH (ref 70–99)
Glucose-Capillary: 209 mg/dL — ABNORMAL HIGH (ref 70–99)

## 2010-04-22 LAB — IRON AND TIBC
Iron: 194 ug/dL — ABNORMAL HIGH (ref 42–135)
UIBC: 42 ug/dL

## 2010-04-22 LAB — MISCELLANEOUS TEST

## 2010-04-22 LAB — VITAMIN B12: Vitamin B-12: 76 pg/mL — ABNORMAL LOW (ref 211–911)

## 2010-04-22 LAB — APTT: aPTT: 28 seconds (ref 24–37)

## 2010-04-22 LAB — FOLATE: Folate: 9.9 ng/mL

## 2010-04-22 LAB — ABO/RH: ABO/RH(D): AB POS

## 2010-04-22 LAB — TISSUE TRANSGLUTAMINASE, IGA: Tissue Transglutaminase Ab, IgA: 0.3 U/mL (ref <7)

## 2010-06-12 NOTE — Assessment & Plan Note (Signed)
 HEALTHCARE                            CARDIOLOGY OFFICE NOTE   NAME:Nicholas Fields, Nicholas Fields                         MRN:          [id number]  DATE:03/10/2007                            DOB:          1955-09-13    The patient is a very pleasant 55 year old gentleman who is status post  PCI of his right coronary in August 2004.  His most recent Myoview in  May 2008 showed an inferior defect consistent with soft tissue  attenuation and scar, but there was no ischemia.  Ejection fraction was  70%.  Since I last saw him he is doing well from a symptomatic  standpoint.  There is no significant dyspnea on exertion, orthopnea,  PND, pedal edema, palpitations, presyncope, syncope or chest pain.   MEDICATIONS:  1. Aspirin 325 mg daily.  2. Vitamin B12.  3. Altace 10 mg p.o. daily.  4. Metoprolol ER 50 mg daily.  5. Amitriptyline 25 mg p.o. daily.  6. Hydrochlorothiazide 12.5 mg daily.  7. Lipitor 80 mg daily.   PHYSICAL EXAMINATION:  (Today) Shows a blood pressure 140/94.  His pulse  is 89.  He weighs 176 pounds.  HEENT:  Normal.  NECK:  Supple.  CHEST:  Clear.  CARDIOVASCULAR:  Regular rate and rhythm.  ABDOMEN:  No tenderness.  EXTREMITIES:  No edema.   ELECTROCARDIOGRAM:  Sinus rhythm at a rate of 89.  There are no ST  changes noted.   DIAGNOSES:  1. Coronary artery disease, status post PCI of the right coronary      artery:  [name] has had no further chest pain or shortness of      breath.  His Myoview is low risk.  We will continue medical      therapy; including his aspirin, statin, ACE inhibitor and beta      blocker.  2. Hypertension:  His blood pressure is elevated today.  Note, the      company is having difficulties making Toprol.  I have therefore      asked him to discontinue his Toprol and will give him atenolol 75      mg p.o. daily.  We will check a BMET, given his diuretic and ACE      inhibitor use.  3. Hyperlipidemia:  We will check  lipids and liver; adjust as      indicated.  He will continue on his statin.  4. He will continue diet and exercise.  Note, he does not smoke.  I      will see him back in 9 months.     [name] [name], MD, [facility]  Electronically Signed    BSC/MedQ  DD: 03/10/2007  DT: 03/11/2007  Job #: [id number]

## 2010-06-12 NOTE — Assessment & Plan Note (Signed)
[facility]                            CARDIOLOGY OFFICE NOTE   NAME:Nicholas Fields, Nicholas Fields                         MRN:          [id number]  DATE:06/04/2006                            DOB:          05-02-1955    Mr. Nicholas Fields returns for followup today.  He is a gentleman who is status  post PCI of his right coronary artery in August of 2004.  His most  recent nuclear study was performed in August of 2006 and showed a  ejection fraction of 73%.  There was a small prior inferior infarct, but  there was no ischemia.  Since I last saw him he is doing reasonably  well.  He denies any dyspnea on exertion, orthopnea, paroxysmal  nocturnal dyspnea, pedal edema or claudication.  He has not had syncope.  He did have one episode of chest pain and it lasted for seconds.  He  felt this may be indigestion as it was in his upper chest.  It did not  radiate.  There was no associated nausea, vomiting, shortness of breath  or diaphoresis.   MEDICATIONS:  1. Aspirin 325 mg p.o. daily.  2. Vitamin B12.  3. Altace 10 mg p.o. daily.  4. Toprol 50 mg p.o. daily.  5. Pravachol 80 mg p.o. daily.  6. Amitriptyline 25 mg p.o. daily.  7. Hydrochlorothiazide 12.5 mg p.o. daily.   PHYSICAL EXAMINATION:  Blood pressure of 132/80 and his pulse is 79.  He  weighs 169 pounds.  NECK:  Supple with no bruits.  CHEST:  Clear.  CARDIOVASCULAR:  Reveals a regular rate and rhythm.  ABDOMINAL:  Shows no pulsatile masses, no bruits.  EXTREMITIES:  Show no edema.  ELECTROCARDIOGRAM:  Shows a sinus rhythm at a rate of 79.  There are no  ST changes noted.   DIAGNOSES:  1. Coronary artery disease status post percutaneous coronary      intervention of the right coronary artery - and the patient had a      recent episode of vague chest pain that is not clearly cardiac.      However, we should risk stratify with a stress Myoview.  If it      shows normal profusion/no ischemia then we will plan to  continue      with medical therapy.  We will continue with his aspirin, statin,      ACE inhibitor and beta blocker.  2. Hypertension - his blood pressure is well controlled on his present      medications.  We will check a BMET to check follow his potassium      and renal function given his diuretic and ACE inhibitor use.  3. Hyperlipidemia - the patient did discontinue Zetia as he felt it      was contributing to cramps and these have improved.  I will check      lipids and liver and we will adjust with a goal LDL of less than      70.  4. Risk factor modification - the patient does not smoke.  We  discussed importance of diet and exercise.   We will see him back in 9 months.     [name] [name], MD, [facility]  Electronically Signed    BSC/MedQ  DD: 06/04/2006  DT: 06/04/2006  Job #: [id number]

## 2010-06-15 NOTE — Discharge Summary (Signed)
NAMEMCGREGOR, TINNON                            ACCOUNT NO.:  [id number]   MEDICAL RECORD NO.:  [id number]                   PATIENT TYPE:  INP   LOCATION:  3736                                 FACILITY:  [facility]   PHYSICIAN:  Vida Roller, M.D.                DATE OF BIRTH:  03/06/1955   DATE OF ADMISSION:  09/18/2002  DATE OF DISCHARGE:  09/21/2002                           DISCHARGE SUMMARY - REFERRING   SUMMARY OF HISTORY:  Mr. Coulthard is a 55 year old white male who presented to  the emergency room after he began having chest discomfort at approximately  5:30 on the morning of presentation.  EKG showed him to have ST segment  elevation indicating inferior wall myocardial infarction.  He was treated  with nitroglycerin, heparin, Lopressor, and aspirin without resolution.  Thus, he went emergently to the catheterization laboratory.  His history is  notable for hypertension which has not been treated and hyperlipidemia which  has not been treated.  He also has an early history of family coronary  artery disease, and he quit smoking approximately 30 years ago.   LABORATORY DATA:  TSH was 0.776.  Fasting cholesterol showed total  cholesterol 159, triglycerides 103, HDL 33, LDL 105.  Initial CK-MB was 1045  with an MB of 125.8 and a relative index of 12, and a troponin of 19.76.  Second CK-MB was 1796 with an MB of 231.7, indices 12.9, and a troponin of  44.01.  Subsequent CK-MBs and troponins were declining.  It is interesting  to note that the ER chest pain clinic drew CK-MBs as per their protocol, and  two MBs were within normal limits at 5.2 and 5.1.  His troponins were also  less than 0.05 x2, and his myoglobins 110 and 99.  BNP was less than 30.  Admission H&H 15.9 and 46.6, normal indices, platelets 170, WBC 6.1.  Sodium  139, potassium 4.3, BUN 23, creatinine 0.7.  Subsequent hematologies were  essentially not notable.  It was noted that on September 21, 2002, prior to  discharge,  platelets were 146.  Subsequent chemistries were essentially not  notable.  His glucose on morning chemistries ranged from 136 to a high of  182 on admission.   Chest x-ray showed mild patchy basilar infiltrates or atelectasis.   EKG on admission showed sinus rhythm, 2 mm inferior ST segment elevation  with 1 in aVL, ST segment depression, PACs.  Subsequent EKGs showed inferior  Q-waves.   HOSPITAL COURSE:  Mr. Patriarca was taken emergently to the cardiac  catheterization laboratory by Dr. Chales Abrahams.  According to Dr. Urban Gibson progress  note, he had a 20% left main, 30% LAD, 50% diagonal 1, 30% circumflex.  The  RCA had a proximal 40% lesion, a 100% mid lesion.  Utilizing a Taxus stent  Dr. Chales Abrahams opened the 100% lesion to 0%.  His EF was 50% with inferior  akinesis.  Dr. Chales Abrahams recommended Plavix for six months.  Post-sheath removal  of bed rest, he was ambulating without difficulty.  Catheterization site had  some evolving ecchymosis.  With his hyperlipidemia and coronary artery  disease, Zocor 40 mg was started.  Lopressor was also begun.  It was noted  that postcatheterization he did have some junctional rhythm.  His beta  blocker was continued since it was felt that this was in relation to his  myocardial infarction.  Repeat chest x-ray, two view, was performed to  evaluate abnormality.  This confirmed atelectasis.  Cardiac rehabilitation  assisted with education and ambulation prior to discharge.  After review,  Dr. [name] felt that the patient could be discharged home on August 24.   DISCHARGE DIAGNOSES:  1. Acute inferior myocardial infarction.  2. Hyperlipidemia.  3. Coronary artery disease, status post stenting to the mid right coronary     artery as previously described.  4. History of hypertension.  5. Obesity.    DISCHARGE MEDICATIONS:  1. Aspirin 325 mg daily.  2. Plavix 75 mg daily for six months.  3. Toprol-XL 25 mg daily.  4. Altace 2.5 mg daily.  5. Nitroglycerin 0.4  as needed.  6. Zocor 40 mg q.h.s.   He was advised no lifting, driving, sexual activity, or heavy exertion until  seen by the physician.  Maintain low-salt, fat, and cholesterol diet.  If he  has any problems with the catheterization site he is asked to call us.  He  will need a BMP next week to evaluate kidney function and potassium.  He  will need fasting lipids and LFTs in approximately six to eight weeks since  Zocor was initiated.  He will see Dr. [name] on October 19, 2002, at  9:40 a.m.  Cardiac rehabilitation gave him exercise guidelines.  He was  instructed not to go back to work until released by Dr. [name].  The  research team will call and schedule a follow-up appointment for the Flame  study.      Joellyn Rued, P.A. [facility]                    Vida Roller, M.D.    EW/MEDQ  D:  09/21/2002  T:  09/21/2002  Job:  [id number]   cc:   Olga Millers, M.D.   [facility]

## 2010-06-15 NOTE — Assessment & Plan Note (Signed)
[facility]                              CARDIOLOGY OFFICE NOTE   NAME:Fields, Nicholas XIONG                         MRN:          [id number]  DATE:10/04/2005                            DOB:          1955-04-08    Nicholas Fields returns for followup today.  He has a history of coronary disease  as outlined in my previous notes.  Since I last saw him, there is no  dyspnea, chest pain, syncope, palpitations, or pedal edema.   MEDICATIONS:  1. Aspirin 325 mg p.o. daily.  2. Vitamin B12.  3. Altace 10 mg p.o. daily.  4. Metoprolol XL 50 mg p.o. daily.  5. Pravachol 80 mg p.o. daily.  6. Zetia 10 mg p.o. daily.  7. Amitriptyline 25 mg p.o. at night.  8. Hydrochlorothiazide 12.5 mg p.o. daily.   PHYSICAL EXAMINATION:  VITAL SIGNS:  Blood pressure 138/82, pulse 76.  NECK:  Supple.  No bruits.  CHEST:  Clear.  CARDIOVASCULAR:  Regular rate and rhythm.  ABDOMEN:  No pulsatile masses.  No bruits.  EXTREMITIES:  No edema.   His electrocardiogram shows a sinus rhythm at a rate of 76.  There are no ST  changes noted.  A prior inferior infarct cannot be excluded.   DIAGNOSES:  1. Coronary artery disease, status post percutaneous coronary intervention      of the right coronary artery.  2. Hypertension.  3. Hyperlipidemia.   PLAN:  Nicholas Fields is doing well from a symptomatic standpoint.  His blood  pressure is well controlled today.  We will continue with his present  medications.  I will check a BMET today to follow his potassium and renal  function.  His most recent lipids showed an LDL of 67.  However,  his HDL was 33.9.  We did recommend a Niaspan but he did not wish to pursue  that at this point.  We discussed the importance of diet and exercise.  I  will see him back in eight months.                              [name] [name], MD, [facility]    BSC/MedQ  DD:  10/04/2005  DT:  10/04/2005  Job #:  [id number]

## 2010-06-15 NOTE — Cardiovascular Report (Signed)
NAMEANUSH, WIEDEMAN                            ACCOUNT NO.:  [id number]   MEDICAL RECORD NO.:  [id number]                   PATIENT TYPE:  INP   LOCATION:  3736                                 FACILITY:  [facility]   PHYSICIAN:  Veneda Melter, M.D.                   DATE OF BIRTH:  Aug 13, 1955   DATE OF PROCEDURE:  09/18/2002  DATE OF DISCHARGE:                              CARDIAC CATHETERIZATION   PROCEDURES PERFORMED:  1. Left heart catheterization.  2. Left ventriculogram.  3. Selective coronary angiography.  4. Placement of temporary RV pacemaker.  5. Percutaneous transluminal coronary angioplasty and stent placement mid     right coronary artery utilizing distal protection.   DIAGNOSES:  1. Severe single-vessel coronary atherosclerotic disease.  2. Low normal left ventricular systolic function.  3. Acute inferior wall myocardial infarction.  4. Junctional bradycardia.   HISTORY:  Mr. Santibanez is a 55 year old white male with history of hypertension  who presents with acute onset of substernal chest discomfort.  The patient  presented to the [facility] Emergency Room where he was found to have ST elevation  in the inferior leads consistent with an acute inferior wall myocardial  infarction.  He was given heparin and aspirin and transferred to the  catheterization lab for emergent treatment.   TECHNIQUE:  Informed consent was obtained.  The patient brought to the  catheterization lab.  A 5 French venous and 7 French arterial sheath were  placed in the right groin using the modified Seldinger technique.  A 6  [place] JL-4 and JR-4 catheter was then used to engage the left and right  coronary arteries and selective angiography performed in various projections  using manual injection contrast.  A 6 French pigtail catheter was advanced  in the left ventricle and a left ventriculogram performed using power  injection contrast.   FINDINGS:   LEFT HEART CATHETERIZATION:  1. Left main trunk:   Large caliber vessel with mild irregularities of 15-     20%.  2. LAD:  This is a medium-caliber vessel that provides two diagonal     branches.  The LAD has mild diffuse disease of 20-30%.  The first     diagonal branch is the larger of the two vessels and has diffuse disease     of up to 50%.  The second diagonal branch is a trivial vessel that arises     distally.  3. Left circumflex artery:  This is a medium caliber vessel that consists of     two marginal branches.  Left circumflex system has diffuse disease of 30-     40% in the mid section.  4. Right coronary artery is dominant.  It is a medium caliber vessel that     provides the small posterior descending artery and posterior ventricular     branch of the terminal segment.  The right  coronary is 100% occluded in     the mid section.  The distal vasculature has mild diffuse disease of 20%.  5. LV:  Normal end-systolic and end-diastolic dimensions.  Overall left     ventricular function is the low normal.  Ejection fraction is     approximately 50%.  There is akinesis of the basal mid inferior wall.  No     mitral regurgitation is noted.  LV pressure is 140/10, aortic is 135/85,     LVEDP equals 20.   With these findings, we elected to proceed with percutaneous intervention to  the mid right coronary artery.  The patient received ReoPro on a weight-  adjusted basis. ACT was approximately 250 seconds. He also received Plavix  600 mg after termination of case.  A 7 [place] JR-4 guide catheter was used  to engage the right coronary artery.  However, significant damping was noted  and this was exchanged for a guide catheter with side holes.  Angiography at  the termination of the case shows concentric narrowing of 30-40% at the  ostium that does not appear to be critical.  Selective guide shots were  obtained  after the administration of intracoronary nitroglycerin showing  distal reperfusion with residual 99% narrowing in the mid RCA.   The patient  was enrolled in the Flame study and received the small easy filter device.  This was deployed distally without difficulty and seemed to oppose the  distal vessel wall adequately. A 3.0 x 12-mm Taxus Express 2 stent was  introduced and carefully positioned in the mid RCA and deployed at 10  atmospheres for 30 seconds.  Repeat angiography showed an excellent result  with full apposition with the vessel wall, significant improvement in vessel  lumen and flow.  The stent delivery system was retracted and a 3.0 x 12-mm  Quantum Maverick balloon introduced.  Unfortunately, the balloon tip could  not be advanced through the stent.  An extra support wire was introduced as  a buddy wire.  However, this still proved difficult and the Quantum Maverick  was then advanced over this extra support wire.  This proved successful and  we were able to dilate the stent at 12 atmospheres for 30 seconds.  The  ballon was retracted and the export sheath introduced and aspiration  performed.  Total of 15 ml was retracted during the case.  No slow was  noted.  The retrieval sheath was then introduced and the distal filter wire  removed without difficulty. Final angiography was then performed showing  excellent result with no residual stenosis in the mid RCA, no evidence of  vessel damage distally and TIMI-3 flow through the vessel.  There was some  sluggish regressive flow.  During the case the patient did have transient  episodes of junctional bradycardia and a temporary RV pacemaker was placed.  After termination of the case, these dysrhythmias had resolved and the  pacemaker was retracted.  The guide catheter was removed.  The sheath  secured in position.  The patient transferred to the CCU in stable  condition.  He had complete resolution of chest pain upon intervention to  the RCA.  FINAL RESULT:  Successful percutaneous transluminal coronary angioplasty and  stent placement in the mid right  coronary artery with reduction of 100%  narrowing to 0% with placement of a 3.0 x 12-mm Taxus Express 2 stent and  improvement of TIMI-0 to TIMI-3 flow.  Veneda Melter, M.D.    NG/MEDQ  D:  09/18/2002  T:  09/19/2002  Job:  [id number]   cc:   [facility]
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
## 2010-07-13 ENCOUNTER — Telehealth: Payer: Self-pay | Admitting: Cardiology

## 2010-07-13 MED ORDER — ATORVASTATIN CALCIUM 80 MG PO TABS: 80.0000 mg | ORAL_TABLET | Freq: Every day | ORAL | Status: DC

## 2010-07-13 NOTE — Telephone Encounter (Signed)
lipitor  Or gentric  80 mg. [facility] on [address].

## 2010-09-12 ENCOUNTER — Other Ambulatory Visit: Payer: Self-pay | Admitting: Internal Medicine

## 2010-09-16 NOTE — Telephone Encounter (Signed)
He was getting B12 through PCP and not [place] so please give me more info about who and where is prescribing and administering B12

## 2010-09-19 ENCOUNTER — Other Ambulatory Visit: Payer: Self-pay | Admitting: *Deleted

## 2010-09-19 MED ORDER — LISINOPRIL 40 MG PO TABS: 40.0000 mg | ORAL_TABLET | Freq: Every day | ORAL | Status: DC

## 2010-09-19 MED ORDER — ATENOLOL 50 MG PO TABS: 50.0000 mg | ORAL_TABLET | Freq: Two times a day (BID) | ORAL | Status: DC

## 2010-09-19 NOTE — Telephone Encounter (Signed)
rx's sent in today for atenolol and lisinopril. Danielle Rankin

## 2010-10-17 ENCOUNTER — Encounter: Payer: Self-pay | Admitting: Cardiology

## 2010-10-19 ENCOUNTER — Ambulatory Visit (INDEPENDENT_AMBULATORY_CARE_PROVIDER_SITE_OTHER): Payer: No Typology Code available for payment source | Admitting: Cardiology

## 2010-10-19 ENCOUNTER — Encounter: Payer: Self-pay | Admitting: Cardiology

## 2010-10-19 DIAGNOSIS — I2510 Atherosclerotic heart disease of native coronary artery without angina pectoris: Secondary | ICD-10-CM

## 2010-10-19 DIAGNOSIS — E7800 Pure hypercholesterolemia, unspecified: Secondary | ICD-10-CM

## 2010-10-19 DIAGNOSIS — E782 Mixed hyperlipidemia: Secondary | ICD-10-CM

## 2010-10-19 DIAGNOSIS — I10 Essential (primary) hypertension: Secondary | ICD-10-CM

## 2010-10-19 NOTE — Patient Instructions (Signed)
Your physician wants you to follow-up in: ONE YEAR  You will receive a reminder letter in the mail two months in advance. If you don't receive a letter, please call our office to schedule the follow-up appointment.   Your physician recommends that you return for lab work in: FASTING

## 2010-10-19 NOTE — Assessment & Plan Note (Signed)
Continue present medications. Plan Myoview when he returns in one year.

## 2010-10-19 NOTE — Assessment & Plan Note (Signed)
Continue statin. Check lipids and liver. 

## 2010-10-19 NOTE — Assessment & Plan Note (Signed)
Blood pressure controlled. Check potassium and renal function. 

## 2010-10-19 NOTE — Progress Notes (Signed)
HPI:The patient is a very pleasant  gentleman who is status post PCI of his right coronary in August 2004.  His most recent Myoview in May 2010 showed normal perfusion.  Ejection fraction was 65%. Echocardiogram in May of 2010 showed normal LV function and a bicuspid aortic valve. There was no aortic stenosis or aortic insufficiency. Since I last saw him in July of 2011 he is doing reasonably well symptomatically. He denies any dyspnea on exertion, orthopnea, PND, chest pain or palpitations.   Current Outpatient Prescriptions  Medication Sig Dispense Refill  . aspirin 325 MG tablet Take 325 mg by mouth daily.        Marland Kitchen atenolol (TENORMIN) 50 MG tablet Take 1 tablet (50 mg total) by mouth 2 (two) times daily.  60 tablet  3  . atorvastatin (LIPITOR) 80 MG tablet Take 1 tablet (80 mg total) by mouth daily.  30 tablet  12  . cyanocobalamin (,VITAMIN B-12,) 1000 MCG/ML injection INJECT 1 ML DAILY TIMES 4 TIMES 4 THEN WEEKLY TIMES 4 THEN MONTHLY AS DIRECTED  20 mL  1  . lisinopril (PRINIVIL,ZESTRIL) 40 MG tablet Take 1 tablet (40 mg total) by mouth daily.  30 tablet  3  . metFORMIN (GLUMETZA) 1000 MG (MOD) 24 hr tablet Take 1,000 mg by mouth daily with breakfast.        . nitroGLYCERIN (NITROSTAT) 0.3 MG SL tablet Place 0.3 mg under the tongue every 5 (five) minutes as needed.           Past Medical History  Diagnosis Date  . Coronary artery disease   . Hyperlipidemia   . Hypertension   . Myocardial infarction     hx of  . Vitamin B12 deficiency   . Enlarged prostate     Past Surgical History  Procedure Date  . Coronary stent placement 08-2002     PCI of his right coronary   . Shoulder surgery     right  . Leg surgery     History   Social History  . Marital Status: Single    Spouse Name: N/A    Number of Children: N/A  . Years of Education: N/A   Occupational History  . Not on file.   Social History Main Topics  . Smoking status: Former Games developer  . Smokeless tobacco: Not on file    . Alcohol Use: No  . Drug Use: No  . Sexually Active: Not on file   Other Topics Concern  . Not on file   Social History Narrative  . No narrative on file    ROS: no fevers or chills, productive cough, hemoptysis, dysphasia, odynophagia, melena, hematochezia, dysuria, hematuria, rash, seizure activity, orthopnea, PND, pedal edema, claudication. Remaining systems are negative.  Physical Exam: Well-developed well-nourished in no acute distress.  Skin is warm and dry.  HEENT is normal.  Neck is supple. No thyromegaly.  Chest is clear to auscultation with normal expansion.  Cardiovascular exam is regular rate and rhythm. 1/6 systolic murmur Abdominal exam nontender or distended. No masses palpated. Extremities show no edema. neuro grossly intact  ECG NSR, CRO IMI, no ST changes.

## 2010-10-23 ENCOUNTER — Other Ambulatory Visit: Payer: Self-pay | Admitting: *Deleted

## 2010-10-23 ENCOUNTER — Other Ambulatory Visit (INDEPENDENT_AMBULATORY_CARE_PROVIDER_SITE_OTHER): Payer: Self-pay | Admitting: *Deleted

## 2010-10-23 ENCOUNTER — Encounter: Payer: Self-pay | Admitting: *Deleted

## 2010-10-23 DIAGNOSIS — E7800 Pure hypercholesterolemia, unspecified: Secondary | ICD-10-CM

## 2010-10-23 DIAGNOSIS — I10 Essential (primary) hypertension: Secondary | ICD-10-CM

## 2010-10-23 LAB — BASIC METABOLIC PANEL
BUN: 24 mg/dL — ABNORMAL HIGH (ref 6–23)
Calcium: 9.3 mg/dL (ref 8.4–10.5)
GFR: 170.61 mL/min (ref >60.00)
Glucose, Bld: 178 mg/dL — ABNORMAL HIGH (ref 70–99)

## 2010-10-23 LAB — HEPATIC FUNCTION PANEL
ALT: 32 U/L (ref 0–53)
AST: 20 U/L (ref 0–37)
Bilirubin, Direct: 0.2 mg/dL (ref 0.0–0.3)
Total Bilirubin: 1 mg/dL (ref 0.3–1.2)

## 2010-10-23 LAB — LIPID PANEL: HDL: 30 mg/dL — ABNORMAL LOW (ref >39.00)

## 2010-10-23 MED ORDER — LISINOPRIL 40 MG PO TABS: 40.0000 mg | ORAL_TABLET | Freq: Every day | ORAL | Status: DC

## 2010-10-23 MED ORDER — ATENOLOL 50 MG PO TABS: 50.0000 mg | ORAL_TABLET | Freq: Two times a day (BID) | ORAL | Status: DC

## 2010-10-23 MED ORDER — ATORVASTATIN CALCIUM 80 MG PO TABS: 80.0000 mg | ORAL_TABLET | Freq: Every day | ORAL | Status: DC

## 2011-10-03 ENCOUNTER — Ambulatory Visit (INDEPENDENT_AMBULATORY_CARE_PROVIDER_SITE_OTHER): Payer: BC Managed Care – PPO | Admitting: Cardiology

## 2011-10-03 ENCOUNTER — Encounter: Payer: Self-pay | Admitting: Cardiology

## 2011-10-03 VITALS — BP 130/84 | HR 83 | Ht 63.0 in | Wt 139.0 lb

## 2011-10-03 DIAGNOSIS — I2510 Atherosclerotic heart disease of native coronary artery without angina pectoris: Secondary | ICD-10-CM

## 2011-10-03 LAB — HEPATIC FUNCTION PANEL
ALT: 25 U/L (ref 0–53)
AST: 19 U/L (ref 0–37)
Bilirubin, Direct: 0.2 mg/dL (ref 0.0–0.3)
Total Bilirubin: 1 mg/dL (ref 0.3–1.2)

## 2011-10-03 LAB — BASIC METABOLIC PANEL
BUN: 22 mg/dL (ref 6–23)
Calcium: 9 mg/dL (ref 8.4–10.5)
GFR: 159.56 mL/min (ref >60.00)
Glucose, Bld: 225 mg/dL — ABNORMAL HIGH (ref 70–99)

## 2011-10-03 NOTE — Assessment & Plan Note (Signed)
Continue statin. Check lipids and liver. 

## 2011-10-03 NOTE — Progress Notes (Signed)
   HPI: The patient is a very pleasant gentleman who is status post PCI of his right coronary in August 2004. His most recent Myoview in May 2010 showed normal perfusion. Ejection fraction was 65%. Echocardiogram in May of 2010 showed normal LV function and a bicuspid aortic valve. There was no aortic stenosis or aortic insufficiency. Since I last saw him in Sept 2012 he is doing reasonably well symptomatically. He denies any dyspnea on exertion, orthopnea, PND, chest pain or palpitations.    Current Outpatient Prescriptions  Medication Sig Dispense Refill  . aspirin 325 MG tablet Take 325 mg by mouth daily.        Marland Kitchen atenolol (TENORMIN) 50 MG tablet Take 1 tablet (50 mg total) by mouth 2 (two) times daily.  60 tablet  12  . atorvastatin (LIPITOR) 80 MG tablet Take 1 tablet (80 mg total) by mouth daily.  30 tablet  12  . cyanocobalamin (,VITAMIN B-12,) 1000 MCG/ML injection INJECT 1 ML DAILY TIMES 4 TIMES 4 THEN WEEKLY TIMES 4 THEN MONTHLY AS DIRECTED  20 mL  1  . lisinopril (PRINIVIL,ZESTRIL) 40 MG tablet Take 1 tablet (40 mg total) by mouth daily.  30 tablet  12  . metFORMIN (GLUMETZA) 1000 MG (MOD) 24 hr tablet Take 1,000 mg by mouth daily with breakfast.        . niacin 500 MG tablet Take 500 mg by mouth 2 (two) times daily with a meal.      . nitroGLYCERIN (NITROSTAT) 0.3 MG SL tablet Place 0.3 mg under the tongue every 5 (five) minutes as needed.           Past Medical History  Diagnosis Date  . Coronary artery disease   . Hyperlipidemia   . Hypertension   . Myocardial infarction     hx of  . Vitamin B12 deficiency   . Enlarged prostate     Past Surgical History  Procedure Date  . Coronary stent placement 08-2002     PCI of his right coronary   . Shoulder surgery     right  . Leg surgery     History   Social History  . Marital Status: Single    Spouse Name: N/A    Number of Children: N/A  . Years of Education: N/A   Occupational History  . Not on file.   Social  History Main Topics  . Smoking status: Former Games developer  . Smokeless tobacco: Not on file  . Alcohol Use: No  . Drug Use: No  . Sexually Active: Not on file   Other Topics Concern  . Not on file   Social History Narrative  . No narrative on file    ROS: some knee arthralgias but no fevers or chills, productive cough, hemoptysis, dysphasia, odynophagia, melena, hematochezia, dysuria, hematuria, rash, seizure activity, orthopnea, PND, pedal edema, claudication. Remaining systems are negative.  Physical Exam: Well-developed well-nourished in no acute distress.  Skin is warm and dry.  HEENT is normal.  Neck is supple.  Chest is clear to auscultation with normal expansion.  Cardiovascular exam is regular rate and rhythm.  Abdominal exam nontender or distended. No masses palpated. Extremities show no edema. neuro grossly intact  ECG sinus rhythm at a rate of 83. No significant ST changes.

## 2011-10-03 NOTE — Assessment & Plan Note (Signed)
Blood pressure controlled. Continue present medications. Check potassium and renal function. 

## 2011-10-03 NOTE — Patient Instructions (Addendum)
Your physician wants you to follow-up in: ONE YEAR WITH DR CRENSHAW You will receive a reminder letter in the mail two months in advance. If you don't receive a letter, please call our office to schedule the follow-up appointment.   Your physician has requested that you have a lexiscan myoview. For further information please visit [email or web address]. Please follow instruction sheet, as given.   Your physician recommends that you HAVE LAB WORK TODAY 

## 2011-10-03 NOTE — Assessment & Plan Note (Signed)
Continue aspirin and statin. Arrange myoview for risk stratification. 

## 2011-10-04 ENCOUNTER — Encounter: Payer: Self-pay | Admitting: *Deleted

## 2011-10-14 ENCOUNTER — Ambulatory Visit (HOSPITAL_COMMUNITY): Payer: BC Managed Care – PPO | Attending: Internal Medicine | Admitting: Radiology

## 2011-10-14 VITALS — BP 118/77 | Ht 63.0 in | Wt 141.0 lb

## 2011-10-14 DIAGNOSIS — R0609 Other forms of dyspnea: Secondary | ICD-10-CM | POA: Insufficient documentation

## 2011-10-14 DIAGNOSIS — R0989 Other specified symptoms and signs involving the circulatory and respiratory systems: Secondary | ICD-10-CM | POA: Insufficient documentation

## 2011-10-14 DIAGNOSIS — R0602 Shortness of breath: Secondary | ICD-10-CM

## 2011-10-14 DIAGNOSIS — Z87891 Personal history of nicotine dependence: Secondary | ICD-10-CM | POA: Insufficient documentation

## 2011-10-14 DIAGNOSIS — I10 Essential (primary) hypertension: Secondary | ICD-10-CM | POA: Insufficient documentation

## 2011-10-14 DIAGNOSIS — I2510 Atherosclerotic heart disease of native coronary artery without angina pectoris: Secondary | ICD-10-CM

## 2011-10-14 DIAGNOSIS — Z8249 Family history of ischemic heart disease and other diseases of the circulatory system: Secondary | ICD-10-CM | POA: Insufficient documentation

## 2011-10-14 MED ORDER — TECHNETIUM TC 99M SESTAMIBI GENERIC - CARDIOLITE
11.0000 | Freq: Once | INTRAVENOUS | Status: AC | PRN
  Administered 2011-10-14: 11 via INTRAVENOUS

## 2011-10-14 MED ORDER — REGADENOSON 0.4 MG/5ML IV SOLN
0.4000 mg | Freq: Once | INTRAVENOUS | Status: AC
  Administered 2011-10-14: 0.4 mg via INTRAVENOUS

## 2011-10-14 MED ORDER — TECHNETIUM TC 99M SESTAMIBI GENERIC - CARDIOLITE
33.0000 | Freq: Once | INTRAVENOUS | Status: AC | PRN
  Administered 2011-10-14: 33 via INTRAVENOUS

## 2011-10-14 NOTE — Progress Notes (Signed)
[facility] SITE 3 NUCLEAR MED [address] [place] [address] [phone]  Cardiology Nuclear Med Study  Nicholas Fields is a 56 y.o. male     MRN : [id number]     DOB: 10/24/1955  Procedure Date: 10/14/2011  Nuclear Med Background Indication for Stress Test:  Evaluation for Ischemia and Stent Patency History:  2004 MI-Heart Cath: RCA occluddedmild N/O DZelsewhere EF: 50%-Stent RCA 05/2008  MPS: EF: 65% ECHO: EF: 55-60%  Cardiac Risk Factors: Family History - CAD, History of Smoking, Hypertension and Lipids  Symptoms:  DOE   Nuclear Pre-Procedure Caffeine/Decaff Intake:  8:30pm NPO After: 8:30pm   Lungs:  clear O2 Sat: 97% on room air. IV 0.9% NS with Angio Cath:  20g  IV Site: R Hand  IV Started by:  Cathlyn Parsons, RN  Chest Size (in):  42 Cup Size: n/a  Height: 5\' 3"  (1.6 m)  Weight:  141 lb (63.957 kg)  BMI:  Body mass index is 24.98 kg/(m^2). Tech Comments:  Atenolol held x 12 hrs.    Nuclear Med Study 1 or 2 day study: 1 day  Stress Test Type:  Eugenie Birks  Reading MD: Dietrich Pates, MD  Order Authorizing Provider:  Ripley Fraise  Resting Radionuclide: Technetium 38m Sestamibi  Resting Radionuclide Dose: 11.0 mCi   Stress Radionuclide:  Technetium 63m Sestamibi  Stress Radionuclide Dose: 33.0 mCi           Stress Protocol Rest HR: 70 Stress HR: 95  Rest BP: 118/77 Stress BP: 125/74  Exercise Time (min): n/a METS: n/a   Predicted Max HR: 163 bpm % Max HR: 58.28 bpm Rate Pressure Product: 03474   Dose of Adenosine (mg):  n/a Dose of Lexiscan: 0.4 mg  Dose of Atropine (mg): n/a Dose of Dobutamine: n/a mcg/kg/min (at max HR)  Stress Test Technologist: Milana Na, EMT-P  Nuclear Technologist:  Domenic Polite, CNMT     Rest Procedure:  Myocardial perfusion imaging was performed at rest 45 minutes following the intravenous administration of Technetium 38m Sestamibi. Rest ECG: NSR - Normal EKG  Stress Procedure:  The patient received IV  Lexiscan 0.4 mg over 15-seconds.  Technetium 81m Sestamibi injected at 30-seconds.  There were no significant changes, sob, lt. Headed, and funny feeling in his abdomen with Lexiscan.  Quantitative spect images were obtained after a 45 minute delay. Stress ECG: No significant change from baseline ECG  QPS Raw Data Images:  Soft tissue (bowel activity, diaphragm) underlies/sits beside heart. Stress Images:  Thinning with decreased tracer counts in the inferior wall (base, mid).  Otherwise normal perfusion. Rest Images:  Very mild increased in counts with incomplete improvement in the inferior region. Subtraction (SDS):  Borderline for ischemia. Transient Ischemic Dilatation (Normal <1.22):  1.08 Lung/Heart Ratio (Normal <0.45):  0.37  Quantitative Gated Spect Images QGS EDV:  65 ml QGS ESV:  19 ml  Impression Exercise Capacity:  Lexiscan with no exercise. BP Response:  Normal blood pressure response. Clinical Symptoms:  No chest pain. ECG Impression:  No significant change from baseline. Comparison with Prior Nuclear Study: Report from 2010 was normal.    Overall Impression:  Mild thinning in the inferior wall (base, mid).  Probably reflects soft tissue attenuation (diaphragm, bowel), cannot exclude small region of  very mild inferior ischemia  Otherwise normal perfusion.  Overall low risk scan.  LV Ejection Fraction: 70%.  LV Wall Motion:  NL LV Function; NL Wall Motion  Dietrich Pates

## 2011-10-18 ENCOUNTER — Telehealth: Payer: Self-pay | Admitting: Cardiology

## 2011-10-18 NOTE — Telephone Encounter (Signed)
MR Daris calling for results stress --results given

## 2011-10-18 NOTE — Telephone Encounter (Signed)
New problem:  Test results.  

## 2011-11-04 ENCOUNTER — Other Ambulatory Visit: Payer: Self-pay | Admitting: Cardiology

## 2011-11-11 ENCOUNTER — Other Ambulatory Visit: Payer: Self-pay | Admitting: Cardiology

## 2012-10-02 ENCOUNTER — Encounter: Payer: Self-pay | Admitting: Cardiology

## 2012-10-02 ENCOUNTER — Ambulatory Visit (INDEPENDENT_AMBULATORY_CARE_PROVIDER_SITE_OTHER): Payer: BC Managed Care – PPO | Admitting: Cardiology

## 2012-10-02 VITALS — BP 118/79 | HR 78 | Wt 136.0 lb

## 2012-10-02 DIAGNOSIS — R002 Palpitations: Secondary | ICD-10-CM | POA: Insufficient documentation

## 2012-10-02 DIAGNOSIS — E782 Mixed hyperlipidemia: Secondary | ICD-10-CM

## 2012-10-02 DIAGNOSIS — I2510 Atherosclerotic heart disease of native coronary artery without angina pectoris: Secondary | ICD-10-CM

## 2012-10-02 LAB — BASIC METABOLIC PANEL
CO2: 28 mEq/L (ref 19–32)
Calcium: 9 mg/dL (ref 8.4–10.5)
Creatinine, Ser: 0.5 mg/dL (ref 0.4–1.5)
GFR: 165.82 mL/min (ref >60.00)

## 2012-10-02 LAB — HEPATIC FUNCTION PANEL
ALT: 26 U/L (ref 0–53)
AST: 17 U/L (ref 0–37)
Albumin: 3.9 g/dL (ref 3.5–5.2)
Alkaline Phosphatase: 76 U/L (ref 39–117)

## 2012-10-02 LAB — LIPID PANEL
Total CHOL/HDL Ratio: 4
Triglycerides: 85 mg/dL (ref 0.0–149.0)

## 2012-10-02 NOTE — Progress Notes (Signed)
   HPI: The patient is a very pleasant gentleman who is status post PCI of his right coronary in August 2004. His most recent Myoview in Sept 2013 showed soft tissue attenuation and mild inferior ischemia cannot be excluded. Ejection fraction was 70%. Echocardiogram in May of 2010 showed normal LV function and a bicuspid aortic valve. There was no aortic stenosis or aortic insufficiency. Since I last saw him in Sept 2013 he is doing reasonably well symptomatically. He denies any dyspnea on exertion, orthopnea, PND, chest pain. Approximately 2-3 weeks ago he had brief palpitations described as a flutter but has had no other symptoms.   Current Outpatient Prescriptions  Medication Sig Dispense Refill  . aspirin 325 MG tablet Take 325 mg by mouth daily.        Marland Kitchen atenolol (TENORMIN) 50 MG tablet TAKE ONE TABLET BY MOUTH TWICE DAILY  60 tablet  11  . cyanocobalamin (,VITAMIN B-12,) 1000 MCG/ML injection every 30 (thirty) days.       Marland Kitchen lisinopril (PRINIVIL,ZESTRIL) 40 MG tablet TAKE ONE TABLET BY MOUTH EVERY DAY  30 tablet  11  . metFORMIN (GLUMETZA) 1000 MG (MOD) 24 hr tablet Take 1,000 mg by mouth daily with breakfast.        . niacin 500 MG tablet Take 500 mg by mouth 2 (two) times daily with a meal.      . nitroGLYCERIN (NITROSTAT) 0.3 MG SL tablet Place 0.3 mg under the tongue every 5 (five) minutes as needed.        Marland Kitchen atorvastatin (LIPITOR) 80 MG tablet TAKE ONE TABLET BY MOUTH EVERY DAY  30 tablet  11   No current facility-administered medications for this visit.     Past Medical History  Diagnosis Date  . Coronary artery disease   . Hyperlipidemia   . Hypertension   . Myocardial infarction     hx of  . Vitamin B12 deficiency   . Enlarged prostate     Past Surgical History  Procedure Laterality Date  . Coronary stent placement  08-2002     PCI of his right coronary   . Shoulder surgery      right  . Leg surgery      History   Social History  . Marital Status: Single   Spouse Name: N/A    Number of Children: N/A  . Years of Education: N/A   Occupational History  . Not on file.   Social History Main Topics  . Smoking status: Former Games developer  . Smokeless tobacco: Not on file  . Alcohol Use: No  . Drug Use: No  . Sexual Activity: Not on file   Other Topics Concern  . Not on file   Social History Narrative  . No narrative on file    ROS: no fevers or chills, productive cough, hemoptysis, dysphasia, odynophagia, melena, hematochezia, dysuria, hematuria, rash, seizure activity, orthopnea, PND, pedal edema, claudication. Remaining systems are negative.  Physical Exam: Well-developed well-nourished in no acute distress.  Skin is warm and dry.  HEENT is normal.  Neck is supple.  Chest is clear to auscultation with normal expansion.  Cardiovascular exam is regular rate and rhythm. 1/6 systolic ejection murmur Abdominal exam nontender or distended. No masses palpated. Extremities show no edema. neuro grossly intact  ECG sinus rhythm with nonspecific ST changes.

## 2012-10-02 NOTE — Assessment & Plan Note (Signed)
Recent brief flutter. If he has more problems in the future we'll consider monitor.

## 2012-10-02 NOTE — Assessment & Plan Note (Signed)
Continue statin. Given recent AIM HIGH study, discontinue niacin. Check lipids and liver.

## 2012-10-02 NOTE — Patient Instructions (Signed)
Your physician wants you to follow-up in:  12 months. You will receive a reminder letter in the mail two months in advance. If you don't receive a letter, please call our office to schedule the follow-up appointment.  Your physician has recommended you make the following change in your medication:  Stop Niacin

## 2012-10-02 NOTE — Assessment & Plan Note (Signed)
Continue aspirin and statin. 

## 2012-10-02 NOTE — Assessment & Plan Note (Signed)
Blood pressure controlled. Continue present medications. Check potassium and renal function. 

## 2012-11-05 ENCOUNTER — Other Ambulatory Visit: Payer: Self-pay | Admitting: Cardiology

## 2012-11-06 ENCOUNTER — Other Ambulatory Visit: Payer: Self-pay

## 2012-11-06 ENCOUNTER — Other Ambulatory Visit: Payer: Self-pay | Admitting: *Deleted

## 2012-11-06 MED ORDER — LISINOPRIL 40 MG PO TABS: ORAL_TABLET | ORAL | Status: DC

## 2012-11-06 MED ORDER — LISINOPRIL 20 MG PO TABS: 20.0000 mg | ORAL_TABLET | Freq: Two times a day (BID) | ORAL | Status: DC

## 2012-11-18 ENCOUNTER — Other Ambulatory Visit: Payer: Self-pay | Admitting: Cardiology

## 2013-06-23 ENCOUNTER — Other Ambulatory Visit: Payer: Self-pay | Admitting: Cardiology

## 2013-09-03 ENCOUNTER — Other Ambulatory Visit: Payer: Self-pay

## 2013-09-03 MED ORDER — ATORVASTATIN CALCIUM 80 MG PO TABS: ORAL_TABLET | ORAL | Status: DC

## 2013-10-25 ENCOUNTER — Ambulatory Visit: Payer: BC Managed Care – PPO | Admitting: Cardiology

## 2013-10-25 ENCOUNTER — Ambulatory Visit (INDEPENDENT_AMBULATORY_CARE_PROVIDER_SITE_OTHER): Payer: BC Managed Care – PPO | Admitting: Cardiology

## 2013-10-25 ENCOUNTER — Encounter: Payer: Self-pay | Admitting: Cardiology

## 2013-10-25 VITALS — BP 150/90 | HR 84 | Ht 63.0 in | Wt 142.2 lb

## 2013-10-25 DIAGNOSIS — I10 Essential (primary) hypertension: Secondary | ICD-10-CM

## 2013-10-25 DIAGNOSIS — E782 Mixed hyperlipidemia: Secondary | ICD-10-CM

## 2013-10-25 DIAGNOSIS — I2510 Atherosclerotic heart disease of native coronary artery without angina pectoris: Secondary | ICD-10-CM

## 2013-10-25 MED ORDER — HYDROCHLOROTHIAZIDE 12.5 MG PO CAPS: 12.5000 mg | ORAL_CAPSULE | Freq: Every day | ORAL | Status: DC

## 2013-10-25 NOTE — Assessment & Plan Note (Signed)
Continue aspirin and statin. 

## 2013-10-25 NOTE — Assessment & Plan Note (Signed)
Blood pressure mildly elevated. Add HCTZ 12.5 mg daily. Check potassium and renal function in one week.

## 2013-10-25 NOTE — Assessment & Plan Note (Signed)
Continue statin. Lipids and liver monitored by primary care. 

## 2013-10-25 NOTE — Patient Instructions (Signed)
Your physician wants you to follow-up in: ONE YEAR WITH DR CRENSHAW You will receive a reminder letter in the mail two months in advance. If you don't receive a letter, please call our office to schedule the follow-up appointment.   START HCTZ 12.5 MG ONCE DAILY  Your physician recommends that you return for lab work in: ONE WEEK 

## 2013-10-25 NOTE — Progress Notes (Signed)
      HPI: FU CAD; status post PCI of his right coronary in August 2004. His most recent Myoview in Sept 2013 showed soft tissue attenuation and mild inferior ischemia cannot be excluded. Ejection fraction was 70%. Echocardiogram in May of 2010 showed normal LV function and a bicuspid aortic valve. There was no aortic stenosis or aortic insufficiency. Since I last saw him the patient denies any dyspnea on exertion, orthopnea, PND, pedal edema, palpitations, syncope or chest pain.    Current Outpatient Prescriptions  Medication Sig Dispense Refill  . amitriptyline (ELAVIL) 50 MG tablet Take 50 mg by mouth at bedtime.      Marland Kitchen aspirin 325 MG tablet Take 325 mg by mouth daily.        Marland Kitchen atenolol (TENORMIN) 50 MG tablet TAKE ONE TABLET BY MOUTH TWICE DAILY  60 tablet  10  . atorvastatin (LIPITOR) 80 MG tablet TAKE ONE TABLET BY MOUTH ONCE DAILY  30 tablet  2  . cyanocobalamin (,VITAMIN B-12,) 1000 MCG/ML injection every 30 (thirty) days.       Marland Kitchen LEVEMIR 100 UNIT/ML injection Inject 17 Units into the skin daily.      Marland Kitchen lisinopril (PRINIVIL,ZESTRIL) 20 MG tablet Take 1 tablet (20 mg total) by mouth 2 (two) times daily.  180 tablet  3  . metFORMIN (GLUMETZA) 1000 MG (MOD) 24 hr tablet Take 1,000 mg by mouth daily with breakfast.        . nitroGLYCERIN (NITROSTAT) 0.3 MG SL tablet Place 0.3 mg under the tongue every 5 (five) minutes as needed.         No current facility-administered medications for this visit.     Past Medical History  Diagnosis Date  . Coronary artery disease   . Hyperlipidemia   . Hypertension   . Myocardial infarction     hx of  . Vitamin B12 deficiency   . Enlarged prostate     Past Surgical History  Procedure Laterality Date  . Coronary stent placement  08-2002     PCI of his right coronary   . Shoulder surgery      right  . Leg surgery      History   Social History  . Marital Status: Single    Spouse Name: N/A    Number of Children: N/A  . Years of  Education: N/A   Occupational History  . Not on file.   Social History Main Topics  . Smoking status: Former Games developer  . Smokeless tobacco: Not on file  . Alcohol Use: No  . Drug Use: No  . Sexual Activity: Not on file   Other Topics Concern  . Not on file   Social History Narrative  . No narrative on file    ROS: no fevers or chills, productive cough, hemoptysis, dysphasia, odynophagia, melena, hematochezia, dysuria, hematuria, rash, seizure activity, orthopnea, PND, pedal edema, claudication. Remaining systems are negative.  Physical Exam: Well-developed well-nourished in no acute distress.  Skin is warm and dry.  HEENT is normal.  Neck is supple.  Chest is clear to auscultation with normal expansion.  Cardiovascular exam is regular rate and rhythm.  Abdominal exam nontender or distended. No masses palpated. Extremities show no edema. neuro grossly intact  ECG-Sinus rhythm at a rate of 84. Nonspecific inferior T-wave changes.

## 2013-11-02 LAB — BASIC METABOLIC PANEL WITH GFR
BUN: 18 mg/dL (ref 6–23)
CALCIUM: 9.2 mg/dL (ref 8.4–10.5)
CHLORIDE: 99 meq/L (ref 96–112)
CO2: 29 mEq/L (ref 19–32)
CREATININE: 0.55 mg/dL (ref 0.50–1.35)
GFR, Est African American: >89 mL/min
Glucose, Bld: 203 mg/dL — ABNORMAL HIGH (ref 70–99)
Potassium: 4 mEq/L (ref 3.5–5.3)
Sodium: 139 mEq/L (ref 135–145)

## 2013-11-04 ENCOUNTER — Other Ambulatory Visit: Payer: Self-pay

## 2013-11-04 MED ORDER — LISINOPRIL 20 MG PO TABS: 20.0000 mg | ORAL_TABLET | Freq: Two times a day (BID) | ORAL | Status: DC

## 2013-11-04 MED ORDER — ATENOLOL 50 MG PO TABS: ORAL_TABLET | ORAL | Status: DC

## 2014-01-06 ENCOUNTER — Other Ambulatory Visit: Payer: Self-pay | Admitting: *Deleted

## 2014-01-06 MED ORDER — ATORVASTATIN CALCIUM 80 MG PO TABS: ORAL_TABLET | ORAL | Status: DC

## 2014-02-13 IMAGING — NM NM MISC PROCEDURE
1 series · 12 of 12 positions shown · non-contrast
Comparison: none

[Series 1: rest raw · 6.40mm/px · 2 acquisitions, 12 frames shown]
[im 1/2]
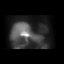
[im 1/2]
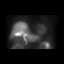
[im 1/2]
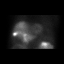
[im 1/2]
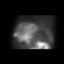
[im 1/2]
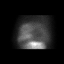
[im 1/2]
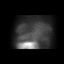
[im 2/2]
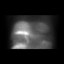
[im 2/2]
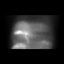
[im 2/2]
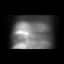
[im 2/2]
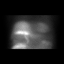
[im 2/2]
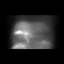
[im 2/2]
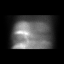

[12 of 12 positions shown; findings below may reference images not displayed]

Canned report from images found in remote index.

Refer to host system for actual result text.

## 2014-10-17 ENCOUNTER — Other Ambulatory Visit: Payer: Self-pay | Admitting: Cardiology

## 2014-10-31 ENCOUNTER — Other Ambulatory Visit: Payer: Self-pay | Admitting: Cardiology

## 2014-11-27 NOTE — Progress Notes (Signed)
HPI: FU CAD; status post PCI of his right coronary in August 2004. His most recent Myoview in Sept 2013 showed soft tissue attenuation and mild inferior ischemia cannot be excluded. Ejection fraction was 70%. Echocardiogram in May of 2010 showed normal LV function and a bicuspid aortic valve. There was no aortic stenosis or aortic insufficiency. Since I last saw him the patient has dyspnea with more extreme activities but not with routine activities. It is relieved with rest. It is not associated with chest pain. There is no orthopnea, PND or pedal edema. There is no syncope or palpitations. There is no exertional chest pain.   Current Outpatient Prescriptions  Medication Sig Dispense Refill  . amitriptyline (ELAVIL) 50 MG tablet Take 50 mg by mouth at bedtime.    Marland Kitchen aspirin 325 MG tablet Take 325 mg by mouth daily.      Marland Kitchen atenolol (TENORMIN) 50 MG tablet TAKE ONE TABLET BY MOUTH TWICE DAILY 60 tablet 1  . atorvastatin (LIPITOR) 80 MG tablet TAKE ONE TABLET BY MOUTH ONCE DAILY 30 tablet 10  . cyanocobalamin (,VITAMIN B-12,) 1000 MCG/ML injection every 30 (thirty) days.     . hydrochlorothiazide (MICROZIDE) 12.5 MG capsule TAKE ONE CAPSULE BY MOUTH ONCE DAILY 90 capsule 3  . LEVEMIR 100 UNIT/ML injection Inject 17 Units into the skin daily. 17 UNITS IN THE AM AND 20 UNITS IN PM    . lisinopril (PRINIVIL,ZESTRIL) 20 MG tablet TAKE ONE TABLET BY MOUTH TWICE DAILY 180 tablet 3  . metFORMIN (GLUMETZA) 1000 MG (MOD) 24 hr tablet Take 1,000 mg by mouth daily with breakfast. 1000 MG IN THE AM AND 1500 MG IN THE PM    . nitroGLYCERIN (NITROSTAT) 0.3 MG SL tablet Place 0.3 mg under the tongue every 5 (five) minutes as needed.       No current facility-administered medications for this visit.     Past Medical History  Diagnosis Date  . Coronary artery disease   . Hyperlipidemia   . Hypertension   . Myocardial infarction (HCC)     hx of  . Vitamin B12 deficiency   . Enlarged prostate      Past Surgical History  Procedure Laterality Date  . Coronary stent placement  08-2002     PCI of his right coronary   . Shoulder surgery      right  . Leg surgery      Social History   Social History  . Marital Status: Single    Spouse Name: N/A  . Number of Children: N/A  . Years of Education: N/A   Occupational History  . Not on file.   Social History Main Topics  . Smoking status: Former Games developer  . Smokeless tobacco: Not on file  . Alcohol Use: No  . Drug Use: No  . Sexual Activity: Not on file   Other Topics Concern  . Not on file   Social History Narrative    ROS: no fevers or chills, productive cough, hemoptysis, dysphasia, odynophagia, melena, hematochezia, dysuria, hematuria, rash, seizure activity, orthopnea, PND, pedal edema, claudication. Remaining systems are negative.  Physical Exam: Well-developed well-nourished in no acute distress.  Skin is warm and dry.  HEENT is normal.  Neck is supple.  Chest is clear to auscultation with normal expansion.  Cardiovascular exam is regular rate and rhythm. 1/6 systolic murmur Abdominal exam nontender or distended. No masses palpated. Extremities show no edema. neuro grossly intact  ECG Sinus rhythm at a  rate of 80. Nonspecific ST changes.

## 2014-11-28 ENCOUNTER — Encounter: Payer: Self-pay | Admitting: Cardiology

## 2014-11-28 ENCOUNTER — Encounter: Payer: Self-pay | Admitting: *Deleted

## 2014-11-28 ENCOUNTER — Ambulatory Visit (INDEPENDENT_AMBULATORY_CARE_PROVIDER_SITE_OTHER): Payer: BLUE CROSS/BLUE SHIELD | Admitting: Cardiology

## 2014-11-28 VITALS — BP 118/82 | HR 80 | Ht 63.0 in | Wt 133.4 lb

## 2014-11-28 DIAGNOSIS — I359 Nonrheumatic aortic valve disorder, unspecified: Secondary | ICD-10-CM | POA: Diagnosis not present

## 2014-11-28 DIAGNOSIS — I10 Essential (primary) hypertension: Secondary | ICD-10-CM | POA: Diagnosis not present

## 2014-11-28 DIAGNOSIS — I2510 Atherosclerotic heart disease of native coronary artery without angina pectoris: Secondary | ICD-10-CM

## 2014-11-28 DIAGNOSIS — Q231 Congenital insufficiency of aortic valve: Secondary | ICD-10-CM

## 2014-11-28 DIAGNOSIS — E782 Mixed hyperlipidemia: Secondary | ICD-10-CM

## 2014-11-28 NOTE — Assessment & Plan Note (Signed)
Continue aspirin and statin. 

## 2014-11-28 NOTE — Assessment & Plan Note (Signed)
Blood pressure controlled. Continue present medications. Potassium and renal function monitored by primary care. 

## 2014-11-28 NOTE — Assessment & Plan Note (Signed)
Continue statin. Lipids and liver monitored by primary care. 

## 2014-11-28 NOTE — Assessment & Plan Note (Signed)
History of bicuspid aortic valve. Plan repeat echocardiogram. This will help also to evaluate aortic root.

## 2014-11-28 NOTE — Patient Instructions (Signed)

## 2014-12-02 ENCOUNTER — Other Ambulatory Visit (HOSPITAL_COMMUNITY): Payer: BLUE CROSS/BLUE SHIELD

## 2014-12-20 ENCOUNTER — Other Ambulatory Visit: Payer: Self-pay | Admitting: Cardiology

## 2014-12-22 ENCOUNTER — Inpatient Hospital Stay (HOSPITAL_COMMUNITY)
Admission: EM | Admit: 2014-12-22 | Discharge: 2014-12-24 | DRG: 638 | Disposition: A | Discharge status: Home / Self Care | Payer: BLUE CROSS/BLUE SHIELD | Attending: Internal Medicine | Admitting: Internal Medicine

## 2014-12-22 ENCOUNTER — Encounter (HOSPITAL_COMMUNITY): Payer: Self-pay | Admitting: *Deleted

## 2014-12-22 DIAGNOSIS — Z79899 Other long term (current) drug therapy: Secondary | ICD-10-CM | POA: Diagnosis not present

## 2014-12-22 DIAGNOSIS — E860 Dehydration: Secondary | ICD-10-CM | POA: Diagnosis present

## 2014-12-22 DIAGNOSIS — Z66 Do not resuscitate: Secondary | ICD-10-CM | POA: Diagnosis present

## 2014-12-22 DIAGNOSIS — R531 Weakness: Secondary | ICD-10-CM | POA: Diagnosis present

## 2014-12-22 DIAGNOSIS — I2510 Atherosclerotic heart disease of native coronary artery without angina pectoris: Secondary | ICD-10-CM | POA: Diagnosis present

## 2014-12-22 DIAGNOSIS — I252 Old myocardial infarction: Secondary | ICD-10-CM | POA: Diagnosis not present

## 2014-12-22 DIAGNOSIS — Z794 Long term (current) use of insulin: Secondary | ICD-10-CM

## 2014-12-22 DIAGNOSIS — E1310 Other specified diabetes mellitus with ketoacidosis without coma: Principal | ICD-10-CM | POA: Diagnosis present

## 2014-12-22 DIAGNOSIS — Z7984 Long term (current) use of oral hypoglycemic drugs: Secondary | ICD-10-CM

## 2014-12-22 DIAGNOSIS — Z9119 Patient's noncompliance with other medical treatment and regimen: Secondary | ICD-10-CM

## 2014-12-22 DIAGNOSIS — Z955 Presence of coronary angioplasty implant and graft: Secondary | ICD-10-CM | POA: Diagnosis not present

## 2014-12-22 DIAGNOSIS — E0810 Diabetes mellitus due to underlying condition with ketoacidosis without coma: Secondary | ICD-10-CM | POA: Diagnosis not present

## 2014-12-22 DIAGNOSIS — I959 Hypotension, unspecified: Secondary | ICD-10-CM | POA: Diagnosis present

## 2014-12-22 DIAGNOSIS — E785 Hyperlipidemia, unspecified: Secondary | ICD-10-CM

## 2014-12-22 DIAGNOSIS — N400 Enlarged prostate without lower urinary tract symptoms: Secondary | ICD-10-CM | POA: Diagnosis present

## 2014-12-22 DIAGNOSIS — Z7982 Long term (current) use of aspirin: Secondary | ICD-10-CM | POA: Diagnosis not present

## 2014-12-22 DIAGNOSIS — I10 Essential (primary) hypertension: Secondary | ICD-10-CM | POA: Diagnosis present

## 2014-12-22 DIAGNOSIS — Q231 Congenital insufficiency of aortic valve: Secondary | ICD-10-CM | POA: Diagnosis not present

## 2014-12-22 DIAGNOSIS — Z87891 Personal history of nicotine dependence: Secondary | ICD-10-CM

## 2014-12-22 DIAGNOSIS — E1110 Type 2 diabetes mellitus with ketoacidosis without coma: Secondary | ICD-10-CM | POA: Diagnosis present

## 2014-12-22 DIAGNOSIS — N179 Acute kidney failure, unspecified: Secondary | ICD-10-CM

## 2014-12-22 HISTORY — DX: Type 2 diabetes mellitus without complications: E11.9

## 2014-12-22 LAB — CBG MONITORING, ED
Glucose-Capillary: >600 mg/dL (ref 65–99)
Glucose-Capillary: 551 mg/dL (ref 65–99)
Glucose-Capillary: 417 mg/dL — ABNORMAL HIGH (ref 65–99)
Glucose-Capillary: 311 mg/dL — ABNORMAL HIGH (ref 65–99)

## 2014-12-22 LAB — URINALYSIS, ROUTINE W REFLEX MICROSCOPIC
Glucose, UA: >1000 mg/dL — AB
Hgb urine dipstick: NEGATIVE
Ketones, ur: 15 mg/dL — AB
Leukocytes, UA: NEGATIVE
Nitrite: NEGATIVE
Protein, ur: NEGATIVE mg/dL
Specific Gravity, Urine: 1.025 (ref 1.005–1.030)
pH: 5 (ref 5.0–8.0)

## 2014-12-22 LAB — CBC
HCT: 44.1 % (ref 39.0–52.0)
HEMATOCRIT: 34.5 % — AB (ref 39.0–52.0)
HEMOGLOBIN: 12.3 g/dL — AB (ref 13.0–17.0)
Hemoglobin: 15.6 g/dL (ref 13.0–17.0)
MCH: 31.5 pg (ref 26.0–34.0)
MCH: 31.6 pg (ref 26.0–34.0)
MCHC: 35.4 g/dL (ref 30.0–36.0)
MCHC: 35.7 g/dL (ref 30.0–36.0)
MCV: 89.1 fL (ref 78.0–100.0)
MCV: 88.7 fL (ref 78.0–100.0)
Platelets: 210 10*3/uL (ref 150–400)
Platelets: 157 10*3/uL (ref 150–400)
RBC: 4.95 MIL/uL (ref 4.22–5.81)
RBC: 3.89 MIL/uL — AB (ref 4.22–5.81)
RDW: 13.3 % (ref 11.5–15.5)
RDW: 13.6 % (ref 11.5–15.5)
WBC: 8.3 10*3/uL (ref 4.0–10.5)
WBC: 5.7 10*3/uL (ref 4.0–10.5)

## 2014-12-22 LAB — BASIC METABOLIC PANEL
ANION GAP: 7 (ref 5–15)
Anion gap: 19 — ABNORMAL HIGH (ref 5–15)
BUN: 69 mg/dL — ABNORMAL HIGH (ref 6–20)
BUN: 49 mg/dL — ABNORMAL HIGH (ref 6–20)
CHLORIDE: 112 mmol/L — AB (ref 101–111)
CO2: 19 mmol/L — ABNORMAL LOW (ref 22–32)
CO2: 22 mmol/L (ref 22–32)
Calcium: 10 mg/dL (ref 8.9–10.3)
Calcium: 7.7 mg/dL — ABNORMAL LOW (ref 8.9–10.3)
Chloride: 93 mmol/L — ABNORMAL LOW (ref 101–111)
Creatinine, Ser: 2.13 mg/dL — ABNORMAL HIGH (ref 0.61–1.24)
Creatinine, Ser: 1.2 mg/dL (ref 0.61–1.24)
GFR calc Af Amer: 37 mL/min — ABNORMAL LOW (ref >60)
GFR calc Af Amer: >60 mL/min (ref >60)
GFR calc non Af Amer: 32 mL/min — ABNORMAL LOW (ref >60)
GLUCOSE: 212 mg/dL — AB (ref 65–99)
Glucose, Bld: 651 mg/dL (ref 65–99)
POTASSIUM: 3.7 mmol/L (ref 3.5–5.1)
Potassium: 4.5 mmol/L (ref 3.5–5.1)
Sodium: 131 mmol/L — ABNORMAL LOW (ref 135–145)
Sodium: 141 mmol/L (ref 135–145)

## 2014-12-22 LAB — I-STAT VENOUS BLOOD GAS, ED
Acid-base deficit: 5 mmol/L — ABNORMAL HIGH (ref 0.0–2.0)
Bicarbonate: 19.4 mEq/L — ABNORMAL LOW (ref 20.0–24.0)
O2 Saturation: 87 %
TCO2: 21 mmol/L (ref 0–100)
pCO2, Ven: 35.4 mmHg — ABNORMAL LOW (ref 45.0–50.0)
pH, Ven: 7.347 — ABNORMAL HIGH (ref 7.250–7.300)
pO2, Ven: 56 mmHg — ABNORMAL HIGH (ref 30.0–45.0)

## 2014-12-22 LAB — URINE MICROSCOPIC-ADD ON: RBC / HPF: NONE SEEN RBC/hpf (ref 0–5)

## 2014-12-22 LAB — MAGNESIUM: MAGNESIUM: 2 mg/dL (ref 1.7–2.4)

## 2014-12-22 LAB — GLUCOSE, CAPILLARY
Glucose-Capillary: 155 mg/dL — ABNORMAL HIGH (ref 65–99)
Glucose-Capillary: 175 mg/dL — ABNORMAL HIGH (ref 65–99)
Glucose-Capillary: 240 mg/dL — ABNORMAL HIGH (ref 65–99)

## 2014-12-22 LAB — BETA-HYDROXYBUTYRIC ACID: Beta-Hydroxybutyric Acid: 4.36 mmol/L — ABNORMAL HIGH (ref 0.05–0.27)

## 2014-12-22 LAB — TROPONIN I: Troponin I: <0.03 ng/mL (ref <0.031)

## 2014-12-22 LAB — OSMOLALITY: OSMOLALITY: 344 mosm/kg — AB (ref 275–295)

## 2014-12-22 MED ORDER — INSULIN REGULAR HUMAN 100 UNIT/ML IJ SOLN: INTRAMUSCULAR | Status: DC

## 2014-12-22 MED ORDER — SODIUM CHLORIDE 0.9 % IV BOLUS (SEPSIS)
1000.0000 mL | Freq: Once | INTRAVENOUS | Status: AC
  Administered 2014-12-22: 1000 mL via INTRAVENOUS

## 2014-12-22 MED ORDER — SODIUM CHLORIDE 0.9 % IV SOLN
INTRAVENOUS | Status: DC
  Filled 2014-12-22: qty 2.5

## 2014-12-22 MED ORDER — ASPIRIN EC 325 MG PO TBEC
325.0000 mg | DELAYED_RELEASE_TABLET | Freq: Every day | ORAL | Status: DC
  Administered 2014-12-22 – 2014-12-24 (×3): 325 mg via ORAL
  Filled 2014-12-22 (×3): qty 1

## 2014-12-22 MED ORDER — SODIUM CHLORIDE 0.9 % IV SOLN
INTRAVENOUS | Status: DC
  Administered 2014-12-22 – 2014-12-24 (×4): via INTRAVENOUS

## 2014-12-22 MED ORDER — SODIUM CHLORIDE 0.9 % IV SOLN
1000.0000 mL | Freq: Once | INTRAVENOUS | Status: AC
  Administered 2014-12-22: 1000 mL via INTRAVENOUS

## 2014-12-22 MED ORDER — CYANOCOBALAMIN 1000 MCG/ML IJ SOLN: 1000.0000 ug | INTRAMUSCULAR | Status: DC

## 2014-12-22 MED ORDER — POTASSIUM CHLORIDE 10 MEQ/100ML IV SOLN
10.0000 meq | INTRAVENOUS | Status: AC
  Administered 2014-12-22 – 2014-12-23 (×4): 10 meq via INTRAVENOUS
  Filled 2014-12-22 (×3): qty 100

## 2014-12-22 MED ORDER — AMITRIPTYLINE HCL 25 MG PO TABS
25.0000 mg | ORAL_TABLET | Freq: Every day | ORAL | Status: DC
  Administered 2014-12-22 – 2014-12-23 (×2): 50 mg via ORAL
  Filled 2014-12-22 (×3): qty 2

## 2014-12-22 MED ORDER — ATORVASTATIN CALCIUM 80 MG PO TABS
80.0000 mg | ORAL_TABLET | Freq: Every day | ORAL | Status: DC
  Administered 2014-12-22 – 2014-12-23 (×2): 80 mg via ORAL
  Filled 2014-12-22 (×3): qty 1

## 2014-12-22 MED ORDER — NITROGLYCERIN 0.4 MG SL SUBL: 0.4000 mg | SUBLINGUAL_TABLET | SUBLINGUAL | Status: DC | PRN

## 2014-12-22 MED ORDER — POTASSIUM CHLORIDE 10 MEQ/100ML IV SOLN
10.0000 meq | Freq: Once | INTRAVENOUS | Status: AC
  Administered 2014-12-22: 10 meq via INTRAVENOUS
  Filled 2014-12-22: qty 100

## 2014-12-22 MED ORDER — SODIUM CHLORIDE 0.9 % IV SOLN
1000.0000 mL | INTRAVENOUS | Status: DC
  Administered 2014-12-22: 1000 mL via INTRAVENOUS

## 2014-12-22 MED ORDER — DEXTROSE-NACL 5-0.45 % IV SOLN
INTRAVENOUS | Status: DC
Start: 2014-12-22 — End: 2014-12-23
  Administered 2014-12-22: 23:00:00 via INTRAVENOUS

## 2014-12-22 MED ORDER — ENOXAPARIN SODIUM 40 MG/0.4ML ~~LOC~~ SOLN
40.0000 mg | SUBCUTANEOUS | Status: DC
  Administered 2014-12-22 – 2014-12-23 (×2): 40 mg via SUBCUTANEOUS
  Filled 2014-12-22 (×3): qty 0.4

## 2014-12-22 MED ORDER — SODIUM CHLORIDE 0.9 % IV SOLN
INTRAVENOUS | Status: DC
  Administered 2014-12-22: 4.9 [IU]/h via INTRAVENOUS
  Filled 2014-12-22: qty 2.5

## 2014-12-22 NOTE — ED Notes (Signed)
Pt states that he stopped taking his insulin for 1 week because he didn't think he needed it. States that today he starting feeling sick and so he took 29 units lantus.

## 2014-12-22 NOTE — ED Provider Notes (Signed)
I saw and evaluated the patient, reviewed the resident's note and I agree with the findings and plan.   EKG Interpretation None      59yM with hyperglycemia. Suspect he may be in diabetic ketoacidosis. Most likely secondary to noncompliance. Patient was in [place] visiting family for the past week and did not take his medication with him. He began feeling poorly about 3 days ago which has been progressively worsening. On arrival patient hypotensive. It is responding to IV fluids. He is drowsy but opens eyes to conversational voice, answers questions appropriately and follows commands.   Initial plan for IV fluids. Anticipate insulin drip once labs resulted. Electrolyte repletion as needed. Admission.  CRITICAL CARE Performed by: Raeford Razor  Total critical care time: 35 minutes Critical care time was exclusive of separately billable procedures and treating other patients. Critical care was necessary to treat or prevent imminent or life-threatening deterioration. Critical care was time spent personally by me on the following activities: development of treatment plan with patient and/or surrogate as well as nursing, discussions with consultants, evaluation of patient's response to treatment, examination of patient, obtaining history from patient or surrogate, ordering and performing treatments and interventions, ordering and review of laboratory studies, ordering and review of radiographic studies, pulse oximetry and re-evaluation of patient's condition.    Raeford Razor, MD 01/01/15 [phone]

## 2014-12-22 NOTE — H&P (Signed)
[facility] History and Physical  ELIYA GEIMAN WUJ:[id number] DOB: 15-Jan-1956 DOA: 12/22/2014  Referring physician: Dr.Mumma. PCP: Kaleen Mask, MD  Specialists: Dr. [name]. Cardiologist.  Chief Complaint: Weakness.  HPI: Nicholas Fields is a 59 y.o. male history of diabetes mellitus type 2, CAD status post stenting, hyperlipidemia presents to the ER because of feeling weak and fatigued. Patient has been recently to [place] where patient for that to take his medications specifically his insulin. Over the last 2-3 days patient has been feeling increasingly weak but denies any chest pain shortness of breath nausea vomiting diarrhea. Patient's blood sugar was found to be more than 600 the ER with anion gap of 19. Patient has been admitted for DKA probably secondary to patient missing his medications. On exam patient appears nonfocal.   Review of Systems: As presented in the history of presenting illness, rest negative.  Past Medical History  Diagnosis Date  . Coronary artery disease   . Hyperlipidemia   . Hypertension   . Myocardial infarction (HCC)     hx of  . Vitamin B12 deficiency   . Enlarged prostate   . Diabetes mellitus without complication [facility])    Past Surgical History  Procedure Laterality Date  . Coronary stent placement  08-2002     PCI of his right coronary   . Shoulder surgery      right  . Leg surgery     Social History:  reports that he has quit smoking. He does not have any smokeless tobacco history on file. He reports that he does not drink alcohol or use illicit drugs. Where does patient live home. Can patient participate in ADLs? Yes.  No Known Allergies  Family History:  Family History  Problem Relation Age of Onset  . Heart attack Mother 58    died  . Heart attack Father     died  . Aneurysm Brother     AAA      Prior to Admission medications   Medication Sig Start Date End Date Taking? Authorizing Provider  amitriptyline (ELAVIL)  50 MG tablet Take 25-50 mg by mouth at bedtime.    Yes Historical Provider, MD  aspirin 325 MG tablet Take 325 mg by mouth daily.     Yes Historical Provider, MD  atenolol (TENORMIN) 50 MG tablet TAKE ONE TABLET BY MOUTH TWICE DAILY 12/21/14  Yes Lewayne Bunting, MD  atorvastatin (LIPITOR) 80 MG tablet TAKE ONE TABLET BY MOUTH ONCE DAILY Patient taking differently: Take 80 mg by mouth daily.  01/06/14  Yes Lewayne Bunting, MD  cyanocobalamin (,VITAMIN B-12,) 1000 MCG/ML injection Inject 1,000 mcg into the muscle every 30 (thirty) days. Last injection 12/21/14 09/12/10  Yes Iva Boop, MD  hydrochlorothiazide (MICROZIDE) 12.5 MG capsule TAKE ONE CAPSULE BY MOUTH ONCE DAILY 11/01/14  Yes Lewayne Bunting, MD  insulin detemir (LEVEMIR) 100 UNIT/ML injection Inject 29 Units into the skin daily.   Yes Historical Provider, MD  lisinopril (PRINIVIL,ZESTRIL) 20 MG tablet TAKE ONE TABLET BY MOUTH TWICE DAILY 11/01/14  Yes Lewayne Bunting, MD  metFORMIN (GLUCOPHAGE) 1000 MG tablet Take 1,000-1,500 mg by mouth 2 (two) times daily with a meal. Take 1 1/2 tablets (1500 mg) by mouth with breakfast and 1 tablet (1000 mg) with supper 11/18/14  Yes Historical Provider, MD  nitroGLYCERIN (NITROSTAT) 0.4 MG SL tablet Place 0.4 mg under the tongue every 5 (five) minutes as needed for chest pain.   Yes Historical Provider, MD  Physical Exam: Filed Vitals:   12/22/14 1917 12/22/14 2000 12/22/14 2030 12/22/14 2104  BP: 94/60  Pulse: 86 83  84  Temp:    98.1 F (36.7 C)  TempSrc:    Oral  Resp: Height:     (1.6 m)  Weight:    58 kg (127 lb 13.9 oz)  SpO2: 94% 92%  98%     General:  Moderately built and nourished.  Eyes: Anicteric no pallor.  ENT: No discharge from the ears eyes nose and mouth.  Neck: No mass felt. No JVD appreciated.  Cardiovascular: S1 and S2 heard.  Respiratory: No rhonchi or crepitations.  Abdomen: Soft nontender bowel sounds  present.  Skin: No rash.  Musculoskeletal: No edema.  Psychiatric: Appears normal.  Neurologic: Alert awake oriented to time place and person. Moves all extremities.  Labs on Admission:  Basic Metabolic Panel:  Recent Labs Lab 12/22/14 1606 12/22/14 1710  NA 131*  --   K 4.5  --   CL 93*  --   CO2 19*  --   GLUCOSE 651*  --   BUN 69*  --   CREATININE 2.13*  --   CALCIUM 10.0  --   MG  --  2.0   Liver Function Tests: No results for input(s): AST, ALT, ALKPHOS, BILITOT, PROT, ALBUMIN in the last 168 hours. No results for input(s): LIPASE, AMYLASE in the last 168 hours. No results for input(s): AMMONIA in the last 168 hours. CBC:  Recent Labs Lab 12/22/14 1606  WBC 8.3  HGB 15.6  HCT 44.1  MCV 89.1  PLT 210   Cardiac Enzymes: No results for input(s): CKTOTAL, CKMB, CKMBINDEX, TROPONINI in the last 168 hours.  BNP (last 3 results) No results for input(s): BNP in the last 8760 hours.  ProBNP (last 3 results) No results for input(s): PROBNP in the last 8760 hours.  CBG:  Recent Labs Lab 12/22/14 1603 12/22/14 1704 12/22/14 1852 12/22/14 2000  GLUCAP >600* 551* 417* 311*    Radiological Exams on Admission: No results found.  EKG: Independently reviewed. Normal sinus rhythm with nonspecific T-wave changes.  Assessment/Plan Principal Problem:   DKA (diabetic ketoacidoses) (HCC) Active Problems:   Coronary atherosclerosis   Bicuspid aortic valve   Hyperlipidemia   ARF (acute renal failure) (HCC)   1. Diabetic ketoacidosis probably preceded by patient missing his medications - at this time patient is receiving aggressive fluid hydration with IV insulin infusion for DKA. Closely follow metabolic panel and once anion gap is corrected change to home dose of patient's long-acting insulin. 2. Acute renal failure - probably from dehydration secondary to uncontrolled diabetes. Patient also was hypotensive on arrival. Hydrate him closely follow metabolic  panel and closely follow intake output. 3. History of hypertension was hypotensive on arrival - holding of patient's antihypertensives secondary to being hypotensive on arrival. Continue with hydration and closely monitor blood pressure trends. Restart blood pressure medicines slowly as indicated. 4. CAD status post stenting - denies any chest pain. Continue antiplatelet agents and statins. Holding of antihypertensives due to hypotensive presentation. 5. History of bicuspid aortic valve.  I have reviewed patient's old charts and labs. Personally reviewed EKG.   DVT Prophylaxis Lovenox.  Code Status: DO NOT RESUSCITATE. Patient is an organ donor.  Family Communication: Discussed with patient.  Disposition Plan: Admit to inpatient.    KAKRAKANDY,ARSHAD N. [facility] Pager [phone].  If 7PM-7AM, please contact night-coverage [email or web address] Password  [email or web address] 12/22/2014, 9:39 PM

## 2014-12-22 NOTE — ED Provider Notes (Signed)
CSN: [id number]     Arrival date & time 12/22/14  1556 History   First MD Initiated Contact with Patient 12/22/14 1606     Chief Complaint  Patient presents with  . Hyperglycemia     (Consider location/radiation/quality/duration/timing/severity/associated sxs/prior Treatment) HPI  Patient is 59 year old male past medical history significant for diabetes, hypertension, CAD s/p stents, who presents to the emergency department with altered mental status. Per patient's family the patient went on a trip over the past week and did not bring his insulin. He states that he has not felt well over the past couple of days. Reports feeling weak, fatigued, nauseated over the past 3 days. Denies headache, chest pain, shortness of breath, emesis, abdominal pain, diarrhea. Pt's family checked his blood glu this morning, found to have glu > 600. Family gave the patient 42 U novolog prior to arrival. Denies recent falls or head injury. Denies fever, slurring of speech, focal weakness, numbness, dizziness.   Past Medical History  Diagnosis Date  . Coronary artery disease   . Hyperlipidemia   . Hypertension   . Myocardial infarction (HCC)     hx of  . Vitamin B12 deficiency   . Enlarged prostate   . Diabetes mellitus without complication [facility])    Past Surgical History  Procedure Laterality Date  . Coronary stent placement  08-2002     PCI of his right coronary   . Shoulder surgery      right  . Leg surgery     Family History  Problem Relation Age of Onset  . Heart attack Mother 82    died  . Heart attack Father     died  . Aneurysm Brother     AAA   Social History  Substance Use Topics  . Smoking status: Former Games developer  . Smokeless tobacco: None  . Alcohol Use: No    Review of Systems  Constitutional: Positive for activity change, appetite change and fatigue. Negative for fever.  HENT: Negative for congestion and trouble swallowing.   Eyes: Negative for visual disturbance.   Respiratory: Negative for chest tightness and shortness of breath.   Cardiovascular: Negative for chest pain and palpitations.  Gastrointestinal: Positive for nausea and vomiting. Negative for abdominal pain and blood in stool.  Genitourinary: Negative for dysuria, urgency, frequency, hematuria and decreased urine volume.  Musculoskeletal: Negative for back pain, joint swelling, neck pain and neck stiffness.  Skin: Negative for rash and wound.  Neurological: Negative for dizziness, seizures, weakness, light-headedness, numbness and headaches.  Psychiatric/Behavioral: Negative for behavioral problems and confusion.      Allergies  Review of patient's allergies indicates no known allergies.  Home Medications   Prior to Admission medications   Medication Sig Start Date End Date Taking? Authorizing Provider  amitriptyline (ELAVIL) 50 MG tablet Take 25-50 mg by mouth at bedtime.    Yes Historical Provider, MD  aspirin 325 MG tablet Take 325 mg by mouth daily.     Yes Historical Provider, MD  atenolol (TENORMIN) 50 MG tablet TAKE ONE TABLET BY MOUTH TWICE DAILY 12/21/14  Yes Lewayne Bunting, MD  atorvastatin (LIPITOR) 80 MG tablet TAKE ONE TABLET BY MOUTH ONCE DAILY Patient taking differently: Take 80 mg by mouth daily.  01/06/14  Yes Lewayne Bunting, MD  cyanocobalamin (,VITAMIN B-12,) 1000 MCG/ML injection Inject 1,000 mcg into the muscle every 30 (thirty) days. Last injection 12/21/14 09/12/10  Yes Iva Boop, MD  hydrochlorothiazide (MICROZIDE) 12.5 MG capsule TAKE ONE  CAPSULE BY MOUTH ONCE DAILY 11/01/14  Yes Lewayne Bunting, MD  insulin detemir (LEVEMIR) 100 UNIT/ML injection Inject 29 Units into the skin daily.   Yes Historical Provider, MD  lisinopril (PRINIVIL,ZESTRIL) 20 MG tablet TAKE ONE TABLET BY MOUTH TWICE DAILY 11/01/14  Yes Lewayne Bunting, MD  metFORMIN (GLUCOPHAGE) 1000 MG tablet Take 1,000-1,500 mg by mouth 2 (two) times daily with a meal. Take 1 1/2 tablets (1500 mg)  by mouth with breakfast and 1 tablet (1000 mg) with supper 11/18/14  Yes Historical Provider, MD  nitroGLYCERIN (NITROSTAT) 0.4 MG SL tablet Place 0.4 mg under the tongue every 5 (five) minutes as needed for chest pain.   Yes Historical Provider, MD   BP 113/73 mmHg  Pulse 73  Temp(Src) 99.2 F (37.3 C) (Oral)  Resp 17  Ht  (1.6 m)  Wt 61.236 kg  BMI 23.92 kg/m2  SpO2 98% Physical Exam  Constitutional: He is oriented to person, place, and time. He appears well-developed and well-nourished.  HENT:  Head: Normocephalic and atraumatic.  Mouth/Throat: Oropharynx is clear and moist.  Eyes: Conjunctivae and EOM are normal. Pupils are equal, round, and reactive to light.  Neck: Normal range of motion. Neck supple. No JVD present. No tracheal deviation present.  Cardiovascular: Regular rhythm, normal heart sounds and intact distal pulses.   Pulmonary/Chest: Effort normal and breath sounds normal. No respiratory distress. He has no wheezes.  Abdominal: Soft. Bowel sounds are normal. He exhibits no distension. There is no tenderness. There is no rebound and no guarding.  Musculoskeletal: Normal range of motion.  Neurological: He is oriented to person, place, and time. He has normal strength and normal reflexes. No cranial nerve deficit or sensory deficit. He displays no seizure activity. Coordination normal. He displays no Babinski's sign on the right side. He displays no Babinski's sign on the left side.  Somnolent, easily arousable to verbal stimuli. Able to answer all questions appropriately, oriented, falls back to sleep immediately after answering questions.  Skin: Skin is warm.  Psychiatric: He has a normal mood and affect.  Nursing note and vitals reviewed.   ED Course  Procedures (including critical care time) Labs Review Labs Reviewed  BASIC METABOLIC PANEL - Abnormal; Notable for the following:    Sodium 131 (*)    Chloride 93 (*)    CO2 19 (*)    Glucose, Bld 651 (*)     BUN 69 (*)    Creatinine, Ser 2.13 (*)    GFR calc non Af Amer 32 (*)    GFR calc Af Amer 37 (*)    Anion gap 19 (*)    All other components within normal limits  URINALYSIS, ROUTINE W REFLEX MICROSCOPIC (NOT AT [facility]) - Abnormal; Notable for the following:    Glucose, UA >1000 (*)    Bilirubin Urine MODERATE (*)    Ketones, ur 15 (*)    All other components within normal limits  OSMOLALITY - Abnormal; Notable for the following:    Osmolality 344 (*)    All other components within normal limits  BETA-HYDROXYBUTYRIC ACID - Abnormal; Notable for the following:    Beta-Hydroxybutyric Acid 4.36 (*)    All other components within normal limits  URINE MICROSCOPIC-ADD ON - Abnormal; Notable for the following:    Squamous Epithelial / LPF 0-5 (*)    Bacteria, UA RARE (*)    All other components within normal limits  HEMOGLOBIN A1C - Abnormal; Notable for the following:  Hgb A1c MFr Bld 12.2 (*)    All other components within normal limits  BASIC METABOLIC PANEL - Abnormal; Notable for the following:    Chloride 112 (*)    Glucose, Bld 212 (*)    BUN 49 (*)    Calcium 7.7 (*)    All other components within normal limits  BASIC METABOLIC PANEL - Abnormal; Notable for the following:    Glucose, Bld 143 (*)    BUN 44 (*)    Calcium 8.1 (*)    All other components within normal limits  BASIC METABOLIC PANEL - Abnormal; Notable for the following:    Glucose, Bld 155 (*)    BUN 38 (*)    Calcium 8.3 (*)    All other components within normal limits  CBC - Abnormal; Notable for the following:    RBC 3.89 (*)    Hemoglobin 12.3 (*)    HCT 34.5 (*)    All other components within normal limits  GLUCOSE, CAPILLARY - Abnormal; Notable for the following:    Glucose-Capillary 240 (*)    All other components within normal limits  GLUCOSE, CAPILLARY - Abnormal; Notable for the following:    Glucose-Capillary 175 (*)    All other components within normal limits  GLUCOSE, CAPILLARY -  Abnormal; Notable for the following:    Glucose-Capillary 155 (*)    All other components within normal limits  GLUCOSE, CAPILLARY - Abnormal; Notable for the following:    Glucose-Capillary 133 (*)    All other components within normal limits  GLUCOSE, CAPILLARY - Abnormal; Notable for the following:    Glucose-Capillary 143 (*)    All other components within normal limits  GLUCOSE, CAPILLARY - Abnormal; Notable for the following:    Glucose-Capillary 139 (*)    All other components within normal limits  GLUCOSE, CAPILLARY - Abnormal; Notable for the following:    Glucose-Capillary 142 (*)    All other components within normal limits  GLUCOSE, CAPILLARY - Abnormal; Notable for the following:    Glucose-Capillary 138 (*)    All other components within normal limits  GLUCOSE, CAPILLARY - Abnormal; Notable for the following:    Glucose-Capillary 121 (*)    All other components within normal limits  GLUCOSE, CAPILLARY - Abnormal; Notable for the following:    Glucose-Capillary 216 (*)    All other components within normal limits  GLUCOSE, CAPILLARY - Abnormal; Notable for the following:    Glucose-Capillary 215 (*)    All other components within normal limits  GLUCOSE, CAPILLARY - Abnormal; Notable for the following:    Glucose-Capillary 38 (*)    All other components within normal limits  GLUCOSE, CAPILLARY - Abnormal; Notable for the following:    Glucose-Capillary 43 (*)    All other components within normal limits  GLUCOSE, CAPILLARY - Abnormal; Notable for the following:    Glucose-Capillary 133 (*)    All other components within normal limits  GLUCOSE, CAPILLARY - Abnormal; Notable for the following:    Glucose-Capillary 219 (*)    All other components within normal limits  CBG MONITORING, ED - Abnormal; Notable for the following:    Glucose-Capillary >600 (*)    All other components within normal limits  I-STAT VENOUS BLOOD GAS, ED - Abnormal; Notable for the following:     pH, Ven 7.347 (*)    pCO2, Ven 35.4 (*)    pO2, Ven 56.0 (*)    Bicarbonate 19.4 (*)    Acid-base  deficit 5.0 (*)    All other components within normal limits  CBG MONITORING, ED - Abnormal; Notable for the following:    Glucose-Capillary 551 (*)    All other components within normal limits  CBG MONITORING, ED - Abnormal; Notable for the following:    Glucose-Capillary 417 (*)    All other components within normal limits  CBG MONITORING, ED - Abnormal; Notable for the following:    Glucose-Capillary 311 (*)    All other components within normal limits  MRSA PCR SCREENING  CBC  MAGNESIUM  TROPONIN I  GLUCOSE, CAPILLARY    Imaging Review No results found. I have personally reviewed and evaluated these images and lab results as part of my medical decision-making.   EKG Interpretation   Date/Time:  Thursday December 22 2014 17:38:59 EST Ventricular Rate:  81 PR Interval:  149 QRS Duration: 88 QT Interval:  373 QTC Calculation: 433 R Axis:   88 Text Interpretation:  Sinus rhythm Borderline right axis deviation  Nonspecific T abnormalities, diffuse leads ED PHYSICIAN INTERPRETATION  AVAILABLE IN [facility] Confirmed by TEST, Record ([id number]) on  12/23/2014 7:21:24 AM      MDM   Final diagnoses:  None   Patient is 59 year old male past medical history significant for diabetes, hypertension, CAD s/p stents, who presents to the emergency department with altered mental status and hyperglycemia.  Glu > 600. On arrival, somnolent, but easy to arouse. Answers questions appropriately, then falls back to sleep. No focal neurologic findings. Afebrile, BP 72/55, HR 80s, SpO2 95% on RA.   Immediately upon arrival, 2 PIV obtained. Given bolus NS. Patient's clinical picture concerning for DKA. Basic labs, VBG, UA obtained.   Glu 651, AG 19, pH 7.35/35/56/19, K 4.5. Patient also found to have AKI with Cr 2.13 (baseline 0.5), likely pre-renal secondary to volume depletion.    Patient in DKA, likely secondary to medication non-compliance. Patient given another bolus NS, followed by maintenance fluid.  Started on insulin gtt at 0.1U/kg/hr. Will replace K once insulin gtt started. BP improved to 90s/70s with IVF resuscitation.   Patient will be admitted to Medicine stepdown for further treatment of DKA. Transferred in stable condition.        Corena Herter, MD 12/25/14 6045  Raeford Razor, MD 01/01/15 [phone]

## 2014-12-22 NOTE — ED Notes (Signed)
CBG monitor read high. Pt lethargic and weak in triage.

## 2014-12-23 DIAGNOSIS — Q231 Congenital insufficiency of aortic valve: Secondary | ICD-10-CM

## 2014-12-23 LAB — BASIC METABOLIC PANEL
ANION GAP: 5 (ref 5–15)
Anion gap: 6 (ref 5–15)
BUN: 44 mg/dL — AB (ref 6–20)
BUN: 38 mg/dL — AB (ref 6–20)
CALCIUM: 8.1 mg/dL — AB (ref 8.9–10.3)
CALCIUM: 8.3 mg/dL — AB (ref 8.9–10.3)
CO2: 22 mmol/L (ref 22–32)
CO2: 23 mmol/L (ref 22–32)
CREATININE: 1.05 mg/dL (ref 0.61–1.24)
CREATININE: 0.96 mg/dL (ref 0.61–1.24)
Chloride: 111 mmol/L (ref 101–111)
Chloride: 110 mmol/L (ref 101–111)
GFR calc Af Amer: >60 mL/min (ref >60)
GFR calc Af Amer: >60 mL/min (ref >60)
GFR calc non Af Amer: >60 mL/min (ref >60)
GFR calc non Af Amer: >60 mL/min (ref >60)
GLUCOSE: 143 mg/dL — AB (ref 65–99)
GLUCOSE: 155 mg/dL — AB (ref 65–99)
Potassium: 3.6 mmol/L (ref 3.5–5.1)
Potassium: 3.9 mmol/L (ref 3.5–5.1)
Sodium: 139 mmol/L (ref 135–145)
Sodium: 138 mmol/L (ref 135–145)

## 2014-12-23 LAB — GLUCOSE, CAPILLARY
GLUCOSE-CAPILLARY: 139 mg/dL — AB (ref 65–99)
GLUCOSE-CAPILLARY: 142 mg/dL — AB (ref 65–99)
GLUCOSE-CAPILLARY: 97 mg/dL (ref 65–99)
GLUCOSE-CAPILLARY: 215 mg/dL — AB (ref 65–99)
Glucose-Capillary: 121 mg/dL — ABNORMAL HIGH (ref 65–99)
Glucose-Capillary: 133 mg/dL — ABNORMAL HIGH (ref 65–99)
Glucose-Capillary: 138 mg/dL — ABNORMAL HIGH (ref 65–99)
Glucose-Capillary: 143 mg/dL — ABNORMAL HIGH (ref 65–99)
Glucose-Capillary: 216 mg/dL — ABNORMAL HIGH (ref 65–99)

## 2014-12-23 LAB — MRSA PCR SCREENING: MRSA BY PCR: NEGATIVE

## 2014-12-23 MED ORDER — LISINOPRIL 20 MG PO TABS
20.0000 mg | ORAL_TABLET | Freq: Two times a day (BID) | ORAL | Status: DC
  Administered 2014-12-23 – 2014-12-24 (×3): 20 mg via ORAL
  Filled 2014-12-23 (×3): qty 1

## 2014-12-23 MED ORDER — ATENOLOL 50 MG PO TABS
50.0000 mg | ORAL_TABLET | Freq: Two times a day (BID) | ORAL | Status: DC
  Administered 2014-12-23 – 2014-12-24 (×3): 50 mg via ORAL
  Filled 2014-12-23 (×3): qty 1

## 2014-12-23 MED ORDER — INSULIN DETEMIR 100 UNIT/ML ~~LOC~~ SOLN
17.0000 [IU] | Freq: Every day | SUBCUTANEOUS | Status: DC
  Administered 2014-12-23 (×2): 17 [IU] via SUBCUTANEOUS
  Filled 2014-12-23 (×3): qty 0.17

## 2014-12-23 MED ORDER — INSULIN ASPART 100 UNIT/ML ~~LOC~~ SOLN
0.0000 [IU] | Freq: Every day | SUBCUTANEOUS | Status: DC
Start: 2014-12-23 — End: 2014-12-24
  Administered 2014-12-23: 2 [IU] via SUBCUTANEOUS

## 2014-12-23 MED ORDER — INSULIN ASPART 100 UNIT/ML ~~LOC~~ SOLN
0.0000 [IU] | Freq: Three times a day (TID) | SUBCUTANEOUS | Status: DC
  Administered 2014-12-23: 5 [IU] via SUBCUTANEOUS
  Administered 2014-12-23: 2 [IU] via SUBCUTANEOUS

## 2014-12-23 NOTE — Progress Notes (Signed)
Report given to Charlynne Pander, Charity fundraiser for [employer].

## 2014-12-23 NOTE — Progress Notes (Signed)
[facility] PROGRESS NOTE    Progress Note   Nicholas Fields SLP:[id number] DOB: 04-Mar-1955 DOA: 12/22/2014 PCP: Kaleen Mask, MD   Brief Narrative:   Nicholas Fields is an 59 y.o. male with DM 2 s/p stenting comes in for fatigue due to DKA.  Assessment/Plan:   DKA (diabetic ketoacidoses) (HCC)due to Non complaince: - off insulin drip, AG < 12 and HCO > 20. - now on lantus plus SSI. - cardiac markers negative. - allow diet.  AKI (acute kidney injury) (HCC): - resolved with IV hydration.  Coronary atherosclerosis: Resume antihypertensive meds in am.  Hyperlipidemia Cont statins    DVT Prophylaxis - Lovenox ordered.  Family Communication: none Disposition Plan: Home when stable. Code Status:     Code Status Orders        Start     Ordered   12/22/14 2138  Do not attempt resuscitation (DNR)   Continuous    Question Answer Comment  In the event of cardiac or respiratory ARREST Do not call a "code blue"   In the event of cardiac or respiratory ARREST Do not perform Intubation, CPR, defibrillation or ACLS   In the event of cardiac or respiratory ARREST Use medication by any route, position, wound care, and other measures to relive pain and suffering. May use oxygen, suction and manual treatment of airway obstruction as needed for comfort.      12/22/14 2138        IV Access:    Peripheral IV   Procedures and diagnostic studies:   No results found.   Medical Consultants:    None.  Anti-Infectives:   Anti-infectives    None      Subjective:    Nicholas Fields feels better  Objective:    Filed Vitals:   12/22/14 2104 12/22/14 2330 12/23/14 0025 12/23/14 0250  BP: 94/60 96/58 104/68 100/60  Pulse: 84 80 78 74  Temp: 98.1 F (36.7 C) 98.2 F (36.8 C)  97.9 F (36.6 C)  TempSrc: Oral Oral  Oral  Resp: 18 17 17 15   Height: 5\' 3"  (1.6 m)     Weight: 58 kg (127 lb 13.9 oz)     SpO2: 98% 97% 96% 98%    Intake/Output Summary  (Last 24 hours) at 12/23/14 0810 Last data filed at 12/23/14 0700  Gross per 24 hour  Intake 1174.31 ml  Output    575 ml  Net 599.31 ml   Filed Weights   12/22/14 1602 12/22/14 1722 12/22/14 2104  Weight: 54.477 kg (120 lb 1.6 oz) 55.339 kg (122 lb) 58 kg (127 lb 13.9 oz)    Exam: Gen:  NAD Cardiovascular:  RRR, No M/R/G Chest and lungs:   CTAB Abdomen:  Abdomen soft, NT/ND, + BS Extremities:  No C/E/C  Data Reviewed:    Labs: Basic Metabolic Panel:  Recent Labs Lab 12/22/14 1606 12/22/14 1710 12/22/14 2158 12/23/14 0114 12/23/14 0512  NA 131*  --  141 139 138  K 4.5  --  3.7 3.6 3.9  CL 93*  --  112* 111 110  CO2 19*  --  22 22 23   GLUCOSE 651*  --  212* 143* 155*  BUN 69*  --  49* 44* 38*  CREATININE 2.13*  --  1.20 1.05 0.96  CALCIUM 10.0  --  7.7* 8.1* 8.3*  MG  --  2.0  --   --   --    GFR Estimated Creatinine Clearance: 65.9 mL/min (by  C-G formula based on Cr of 0.96). Liver Function Tests: No results for input(s): AST, ALT, ALKPHOS, BILITOT, PROT, ALBUMIN in the last 168 hours. No results for input(s): LIPASE, AMYLASE in the last 168 hours. No results for input(s): AMMONIA in the last 168 hours. Coagulation profile No results for input(s): INR, PROTIME in the last 168 hours.  CBC:  Recent Labs Lab 12/22/14 1606 12/22/14 2158  WBC 8.3 5.7  HGB 15.6 12.3*  HCT 44.1 34.5*  MCV 89.1 88.7  PLT 210 157   Cardiac Enzymes:  Recent Labs Lab 12/22/14 2158  TROPONINI <0.03   BNP (last 3 results) No results for input(s): PROBNP in the last 8760 hours. CBG:  Recent Labs Lab 12/23/14 0018 12/23/14 0119 12/23/14 0218 12/23/14 0322 12/23/14 0513  GLUCAP 133* 143* 139* 142* 138*   D-Dimer: No results for input(s): DDIMER in the last 72 hours. Hgb A1c: No results for input(s): HGBA1C in the last 72 hours. Lipid Profile: No results for input(s): CHOL, HDL, LDLCALC, TRIG, CHOLHDL, LDLDIRECT in the last 72 hours. Thyroid function  studies: No results for input(s): TSH, T4TOTAL, T3FREE, THYROIDAB in the last 72 hours.  Invalid input(s): FREET3 Anemia work up: No results for input(s): VITAMINB12, FOLATE, FERRITIN, TIBC, IRON, RETICCTPCT in the last 72 hours. Sepsis Labs:  Recent Labs Lab 12/22/14 1606 12/22/14 2158  WBC 8.3 5.7   Microbiology Recent Results (from the past 240 hour(s))  MRSA PCR Screening     Status: None   Collection Time: 12/22/14 12:50 AM  Result Value Ref Range Status   MRSA by PCR NEGATIVE NEGATIVE Final    Comment:        The GeneXpert MRSA Assay (FDA approved for NASAL specimens only), is one component of a comprehensive MRSA colonization surveillance program. It is not intended to diagnose MRSA infection nor to guide or monitor treatment for MRSA infections.      Medications:   . amitriptyline  25-50 mg Oral QHS  . aspirin EC  325 mg Oral Daily  . atorvastatin  80 mg Oral q1800  . [START ON 12/20/2015] cyanocobalamin  1,000 mcg Intramuscular Q30 days  . enoxaparin (LOVENOX) injection  40 mg Subcutaneous Q24H  . insulin aspart  0-15 Units Subcutaneous TID WC  . insulin aspart  0-5 Units Subcutaneous QHS  . insulin detemir  17 Units Subcutaneous QHS   Continuous Infusions: . sodium chloride 100 mL/hr at 12/23/14 0600    Time spent: 25 min   LOS: 1 day   Marinda Elk  [facility] Pager [phone]  *Please refer to [email or web address], password [email or web address] to get updated schedule on who will round on this patient, as hospitalists switch teams weekly. If 7PM-7AM, please contact night-coverage at [email or web address], password [email or web address] for any overnight needs.  12/23/2014, 8:10 AM

## 2014-12-24 LAB — GLUCOSE, CAPILLARY
GLUCOSE-CAPILLARY: 133 mg/dL — AB (ref 65–99)
GLUCOSE-CAPILLARY: 219 mg/dL — AB (ref 65–99)
Glucose-Capillary: 43 mg/dL — CL (ref 65–99)

## 2014-12-24 LAB — HEMOGLOBIN A1C
Hgb A1c MFr Bld: 12.2 % — ABNORMAL HIGH (ref 4.8–5.6)
MEAN PLASMA GLUCOSE: 303 mg/dL

## 2014-12-24 MED ORDER — INSULIN ASPART 100 UNIT/ML ~~LOC~~ SOLN
0.0000 [IU] | Freq: Three times a day (TID) | SUBCUTANEOUS | Status: DC
  Administered 2014-12-24: 3 [IU] via SUBCUTANEOUS

## 2014-12-24 MED ORDER — INSULIN ASPART 100 UNIT/ML ~~LOC~~ SOLN: 0.0000 [IU] | Freq: Every day | SUBCUTANEOUS | Status: DC

## 2014-12-24 MED ORDER — INSULIN ASPART 100 UNIT/ML ~~LOC~~ SOLN
3.0000 [IU] | Freq: Three times a day (TID) | SUBCUTANEOUS | Status: DC
Start: 2014-12-24 — End: 2014-12-24
  Administered 2014-12-24: 3 [IU] via SUBCUTANEOUS

## 2014-12-24 MED ORDER — DEXTROSE 50 % IV SOLN
INTRAVENOUS | Status: AC
  Administered 2014-12-24: 25 mL
  Filled 2014-12-24: qty 50

## 2014-12-24 NOTE — Progress Notes (Signed)
Discharge home.Home Discharge instruction given. No question verbalized.

## 2014-12-24 NOTE — Discharge Summary (Signed)
Physician Discharge Summary  Nicholas Fields ZOX:[id number] DOB: February 28, 1955 DOA: 12/22/2014  PCP: Kaleen Mask, MD  Admit date: 12/22/2014 Discharge date: 12/24/2014  Time spent: 35 minutes  Recommendations for Outpatient Follow-up:  1. Follow up with PCP in2-4 months   Discharge Diagnoses:  Principal Problem:   DKA (diabetic ketoacidoses) (HCC) Active Problems:   Coronary atherosclerosis   Bicuspid aortic valve   Hyperlipidemia   AKI (acute kidney injury) [facility])   Discharge Condition: stable  Diet recommendation: ADA  Filed Weights   12/22/14 1722 12/22/14 2104 12/23/14 1342  Weight: 55.339 kg (122 lb) 58 kg (127 lb 13.9 oz) 61.236 kg (135 lb)    History of present illness:  59 y.o. male history of diabetes mellitus type 2, CAD status post stenting, hyperlipidemia presents to the ER because of feeling weak and fatigued. Patient has been recently to [place] where patient for that to take his medications specifically his insulin.  Hospital Course:  DKA (diabetic ketoacidoses) (HCC)due to Non complaince: - Started on insulin drip once AG < 12 and HCO > 20, transition to long acting. - Add short acting with meals. - due to non compliance.  AKI (acute kidney injury) (HCC): - resolved with IV hydration.  Coronary atherosclerosis: No changes made.  Hyperlipidemia Cont statins   ProceduresNon  Consultations:  none  Discharge Exam: Filed Vitals:   12/24/14 0141 12/24/14 0548  BP: 109/68 113/73  Pulse: 74 73  Temp: 97.8 F (36.6 C) 99.2 F (37.3 C)  Resp: 16 17    General: A&O x3 Cardiovascular: RRR Respiratory: good air movement CTA B/L  Discharge Instructions   Discharge Instructions    Diet - low sodium heart healthy    Complete by:  As directed      Increase activity slowly    Complete by:  As directed           Current Discharge Medication List    CONTINUE these medications which have NOT CHANGED   Details  amitriptyline (ELAVIL)  50 MG tablet Take 25-50 mg by mouth at bedtime.     aspirin 325 MG tablet Take 325 mg by mouth daily.      atenolol (TENORMIN) 50 MG tablet TAKE ONE TABLET BY MOUTH TWICE DAILY Qty: 60 tablet, Refills: 10    atorvastatin (LIPITOR) 80 MG tablet TAKE ONE TABLET BY MOUTH ONCE DAILY Qty: 30 tablet, Refills: 10    cyanocobalamin (,VITAMIN B-12,) 1000 MCG/ML injection Inject 1,000 mcg into the muscle every 30 (thirty) days. Last injection 12/21/14    hydrochlorothiazide (MICROZIDE) 12.5 MG capsule TAKE ONE CAPSULE BY MOUTH ONCE DAILY Qty: 90 capsule, Refills: 3    insulin detemir (LEVEMIR) 100 UNIT/ML injection Inject 29 Units into the skin daily.    lisinopril (PRINIVIL,ZESTRIL) 20 MG tablet TAKE ONE TABLET BY MOUTH TWICE DAILY Qty: 180 tablet, Refills: 3    metFORMIN (GLUCOPHAGE) 1000 MG tablet Take 1,000-1,500 mg by mouth 2 (two) times daily with a meal. Take 1 1/2 tablets (1500 mg) by mouth with breakfast and 1 tablet (1000 mg) with supper Refills: 0    nitroGLYCERIN (NITROSTAT) 0.4 MG SL tablet Place 0.4 mg under the tongue every 5 (five) minutes as needed for chest pain.       No Known Allergies Follow-up Information    Follow up with Kaleen Mask, MD In 2 weeks.   Specialty:  Family Medicine   Why:  hospital follow up   Contact information:   [address] [employer]  [place] [address] [phone]        The results of significant diagnostics from this hospitalization (including imaging, microbiology, ancillary and laboratory) are listed below for reference.    Significant Diagnostic Studies: No results found.  Microbiology: Recent Results (from the past 240 hour(s))  MRSA PCR Screening     Status: None   Collection Time: 12/22/14 12:50 AM  Result Value Ref Range Status   MRSA by PCR NEGATIVE NEGATIVE Final    Comment:        The GeneXpert MRSA Assay (FDA approved for NASAL specimens only), is one component of a comprehensive MRSA  colonization surveillance program. It is not intended to diagnose MRSA infection nor to guide or monitor treatment for MRSA infections.      Labs: Basic Metabolic Panel:  Recent Labs Lab 12/22/14 1606 12/22/14 1710 12/22/14 2158 12/23/14 0114 12/23/14 0512  NA 131*  --  141 139 138  K 4.5  --  3.7 3.6 3.9  CL 93*  --  112* 111 110  CO2 19*  --  22 22 23   GLUCOSE 651*  --  212* 143* 155*  BUN 69*  --  49* 44* 38*  CREATININE 2.13*  --  1.20 1.05 0.96  CALCIUM 10.0  --  7.7* 8.1* 8.3*  MG  --  2.0  --   --   --    Liver Function Tests: No results for input(s): AST, ALT, ALKPHOS, BILITOT, PROT, ALBUMIN in the last 168 hours. No results for input(s): LIPASE, AMYLASE in the last 168 hours. No results for input(s): AMMONIA in the last 168 hours. CBC:  Recent Labs Lab 12/22/14 1606 12/22/14 2158  WBC 8.3 5.7  HGB 15.6 12.3*  HCT 44.1 34.5*  MCV 89.1 88.7  PLT 210 157   Cardiac Enzymes:  Recent Labs Lab 12/22/14 2158  TROPONINI <0.03   BNP: BNP (last 3 results) No results for input(s): BNP in the last 8760 hours.  ProBNP (last 3 results) No results for input(s): PROBNP in the last 8760 hours.  CBG:  Recent Labs Lab 12/23/14 2121 12/24/14 0736 12/24/14 0738 12/24/14 0827 12/24/14 1123  GLUCAP 215* 38* 43* 133* 219*       Signed:  Marinda Elk  [facility] 12/24/2014, 12:04 PM

## 2014-12-24 NOTE — Discharge Instructions (Signed)
Nicholas Fields was admitted to the Hospital on 12/22/2014 and Discharged on Discharge Date 12/24/2014 and should be excused from work/school   for 3   days starting 12/22/2014 , may return to work/school without any restrictions.  Call Lambert Keto MD, [facility] [phone] with questions.  Marinda Elk M.D on 12/24/2014,at 12:04 PM  [facility] Office  [phone]

## 2014-12-26 LAB — GLUCOSE, CAPILLARY: Glucose-Capillary: 38 mg/dL — CL (ref 65–99)

## 2015-02-24 ENCOUNTER — Other Ambulatory Visit: Payer: Self-pay | Admitting: Cardiology

## 2015-02-24 NOTE — Telephone Encounter (Signed)
Rx(s) sent to pharmacy electronically.  

## 2015-11-03 ENCOUNTER — Other Ambulatory Visit: Payer: Self-pay | Admitting: Cardiology

## 2015-11-20 ENCOUNTER — Other Ambulatory Visit: Payer: Self-pay | Admitting: Cardiology

## 2015-11-20 NOTE — Telephone Encounter (Signed)
Rx request sent to pharmacy.  

## 2015-11-28 NOTE — Progress Notes (Signed)
HPI: FU CAD; status post PCI of his right coronary in August 2004. His most recent Myoview in Sept 2013 showed soft tissue attenuation and mild inferior ischemia cannot be excluded. Ejection fraction was 70%. Echocardiogram in May of 2010 showed normal LV function and a bicuspid aortic valve. There was no aortic stenosis or aortic insufficiency. Echocardiogram ordered at last office visit but not performed. Since I last saw him the patient denies any dyspnea on exertion, orthopnea, PND, pedal edema, palpitations, syncope or chest pain.   Current Outpatient Prescriptions  Medication Sig Dispense Refill  . amitriptyline (ELAVIL) 50 MG tablet Take 25-50 mg by mouth at bedtime.     Marland Kitchen aspirin 325 MG tablet Take 325 mg by mouth daily.      Marland Kitchen atenolol (TENORMIN) 50 MG tablet TAKE ONE TABLET BY MOUTH TWICE DAILY 60 tablet 10  . atorvastatin (LIPITOR) 80 MG tablet Take 1 tablet (80 mg total) by mouth daily. 30 tablet 9  . cyanocobalamin (,VITAMIN B-12,) 1000 MCG/ML injection Inject 1,000 mcg into the muscle every 30 (thirty) days. Last injection 12/21/14    . hydrochlorothiazide (MICROZIDE) 12.5 MG capsule TAKE ONE CAPSULE BY MOUTH ONCE DAILY 90 capsule 1  . insulin detemir (LEVEMIR) 100 UNIT/ML injection Inject 29 Units into the skin daily.    Marland Kitchen lisinopril (PRINIVIL,ZESTRIL) 20 MG tablet TAKE ONE TABLET BY MOUTH TWICE DAILY 180 tablet 3  . metFORMIN (GLUCOPHAGE) 1000 MG tablet Take 1,000-1,500 mg by mouth 2 (two) times daily with a meal. Take 1 1/2 tablets (1500 mg) by mouth with breakfast and 1 tablet (1000 mg) with supper  0  . nitroGLYCERIN (NITROSTAT) 0.4 MG SL tablet Place 0.4 mg under the tongue every 5 (five) minutes as needed for chest pain.     No current facility-administered medications for this visit.      Past Medical History:  Diagnosis Date  . Coronary artery disease   . Diabetes mellitus without complication (HCC)   . Enlarged prostate   . Hyperlipidemia   . Hypertension     . Myocardial infarction    hx of  . Vitamin B12 deficiency     Past Surgical History:  Procedure Laterality Date  . CORONARY STENT PLACEMENT  08-2002    PCI of his right coronary   . LEG SURGERY    . SHOULDER SURGERY     right    Social History   Social History  . Marital status: Single    Spouse name: N/A  . Number of children: N/A  . Years of education: N/A   Occupational History  . Not on file.   Social History Main Topics  . Smoking status: Former Games developer  . Smokeless tobacco: Never Used  . Alcohol use No  . Drug use: No  . Sexual activity: Not on file   Other Topics Concern  . Not on file   Social History Narrative  . No narrative on file    Family History  Problem Relation Age of Onset  . Heart attack Father     died  . Heart attack Mother 70    died  . Aneurysm Brother     AAA    ROS: no fevers or chills, productive cough, hemoptysis, dysphasia, odynophagia, melena, hematochezia, dysuria, hematuria, rash, seizure activity, orthopnea, PND, pedal edema, claudication. Remaining systems are negative.  Physical Exam: Well-developed well-nourished in no acute distress.  Skin is warm and dry.  HEENT is normal.  Neck is  supple.  Chest is clear to auscultation with normal expansion.  Cardiovascular exam is regular rate and rhythm.  Abdominal exam nontender or distended. No masses palpated. Extremities show no edema. neuro grossly intact  ECG-NSR, no ST changes  A/P  1 . Hyperlipidemia-continue statin. Lipids and liver monitored by primary care.  2 hypertension-blood pressure controlled. Continue present medications.  3 coronary artery disease-continue aspirin and statin.  4 history of bicuspid aortic valve-repeat echo to assess bicuspid aortic valve and aortic root.  Olga MillersBrian Crenshaw, MD

## 2015-12-04 ENCOUNTER — Encounter: Payer: Self-pay | Admitting: Cardiology

## 2015-12-04 ENCOUNTER — Ambulatory Visit (INDEPENDENT_AMBULATORY_CARE_PROVIDER_SITE_OTHER): Payer: BLUE CROSS/BLUE SHIELD | Admitting: Cardiology

## 2015-12-04 VITALS — BP 132/76 | HR 97 | Ht 62.0 in | Wt 138.0 lb

## 2015-12-04 DIAGNOSIS — I2510 Atherosclerotic heart disease of native coronary artery without angina pectoris: Secondary | ICD-10-CM

## 2015-12-04 DIAGNOSIS — I10 Essential (primary) hypertension: Secondary | ICD-10-CM | POA: Diagnosis not present

## 2015-12-04 DIAGNOSIS — E782 Mixed hyperlipidemia: Secondary | ICD-10-CM | POA: Diagnosis not present

## 2015-12-04 DIAGNOSIS — I359 Nonrheumatic aortic valve disorder, unspecified: Secondary | ICD-10-CM

## 2015-12-04 NOTE — Patient Instructions (Signed)

## 2015-12-25 ENCOUNTER — Other Ambulatory Visit: Payer: Self-pay

## 2015-12-25 ENCOUNTER — Ambulatory Visit (HOSPITAL_COMMUNITY): Payer: BLUE CROSS/BLUE SHIELD | Attending: Cardiovascular Disease

## 2015-12-25 DIAGNOSIS — I359 Nonrheumatic aortic valve disorder, unspecified: Secondary | ICD-10-CM

## 2016-02-08 ENCOUNTER — Other Ambulatory Visit: Payer: Self-pay | Admitting: Cardiology

## 2016-02-09 NOTE — Telephone Encounter (Signed)
Rx(s) sent to pharmacy electronically.  

## 2016-03-01 ENCOUNTER — Other Ambulatory Visit: Payer: Self-pay | Admitting: Cardiology

## 2016-06-06 ENCOUNTER — Other Ambulatory Visit: Payer: Self-pay | Admitting: Cardiology

## 2016-06-06 NOTE — Telephone Encounter (Signed)
REFILL 

## 2016-11-04 ENCOUNTER — Other Ambulatory Visit: Payer: Self-pay | Admitting: Cardiology

## 2016-11-04 NOTE — Telephone Encounter (Signed)
Rx request sent to pharmacy.  

## 2017-01-22 DIAGNOSIS — I219 Acute myocardial infarction, unspecified: Secondary | ICD-10-CM | POA: Insufficient documentation

## 2017-01-22 DIAGNOSIS — N400 Enlarged prostate without lower urinary tract symptoms: Secondary | ICD-10-CM | POA: Insufficient documentation

## 2017-01-22 DIAGNOSIS — I10 Essential (primary) hypertension: Secondary | ICD-10-CM | POA: Insufficient documentation

## 2017-01-22 DIAGNOSIS — I2510 Atherosclerotic heart disease of native coronary artery without angina pectoris: Secondary | ICD-10-CM | POA: Insufficient documentation

## 2017-01-22 DIAGNOSIS — E538 Deficiency of other specified B group vitamins: Secondary | ICD-10-CM | POA: Insufficient documentation

## 2017-01-22 DIAGNOSIS — E119 Type 2 diabetes mellitus without complications: Secondary | ICD-10-CM | POA: Insufficient documentation

## 2017-01-27 ENCOUNTER — Other Ambulatory Visit: Payer: Self-pay | Admitting: Cardiology

## 2017-01-27 NOTE — Telephone Encounter (Signed)
REFILL 

## 2017-02-04 NOTE — Progress Notes (Signed)
HPI: FU CAD; status post PCI of his right coronary in August 2004. His most recent Myoview in Sept 2013 showed soft tissue attenuation and mild inferior ischemia cannot be excluded. Ejection fraction was 70%. Echocardiogram November 2017 showed normal LV function, bicuspid aortic valve and dilated coronary sinus.  Since I last saw him the patient denies any dyspnea on exertion, orthopnea, PND, pedal edema, palpitations, syncope or chest pain.   Current Outpatient Medications  Medication Sig Dispense Refill  . amitriptyline (ELAVIL) 50 MG tablet Take 25-50 mg by mouth at bedtime.     Marland Kitchen aspirin 325 MG tablet Take 325 mg by mouth daily.      Marland Kitchen atenolol (TENORMIN) 50 MG tablet take 1 tablet by mouth twice a day 60 tablet 10  . atorvastatin (LIPITOR) 80 MG tablet TAKE ONE TABLET BY MOUTH ONCE DAILY 30 tablet 9  . Cyanocobalamin (B12 LIQUID HEALTH BOOSTER PO) Take 1 each by mouth as needed.    . hydrochlorothiazide (MICROZIDE) 12.5 MG capsule TAKE ONE CAPSULE BY MOUTH ONCE DAILY 90 capsule 2  . insulin detemir (LEVEMIR) 100 UNIT/ML injection Inject into the skin daily. Take 50 units and 18 units after supper    . lisinopril (PRINIVIL,ZESTRIL) 20 MG tablet Take 1 tablet (20 mg total) by mouth 2 (two) times daily. NEED OV. 180 tablet 0  . metFORMIN (GLUCOPHAGE) 1000 MG tablet Take 1,000-1,500 mg by mouth 2 (two) times daily with a meal. Take 1 1/2 tablets (1500 mg) by mouth with breakfast and 1 tablet (1000 mg) with supper  0  . nitroGLYCERIN (NITROSTAT) 0.4 MG SL tablet Place 0.4 mg under the tongue every 5 (five) minutes as needed for chest pain.     No current facility-administered medications for this visit.      Past Medical History:  Diagnosis Date  . Coronary artery disease   . Diabetes mellitus without complication (HCC)   . Enlarged prostate   . Hyperlipidemia   . Hypertension   . Myocardial infarction (HCC)    hx of  . Vitamin B12 deficiency     Past Surgical History:    Procedure Laterality Date  . CORONARY STENT PLACEMENT  08-2002    PCI of his right coronary   . LEG SURGERY    . SHOULDER SURGERY     right    Social History   Socioeconomic History  . Marital status: Single    Spouse name: Not on file  . Number of children: Not on file  . Years of education: Not on file  . Highest education level: Not on file  Social Needs  . Financial resource strain: Not on file  . Food insecurity - worry: Not on file  . Food insecurity - inability: Not on file  . Transportation needs - medical: Not on file  . Transportation needs - non-medical: Not on file  Occupational History  . Not on file  Tobacco Use  . Smoking status: Former Games developer  . Smokeless tobacco: Never Used  Substance and Sexual Activity  . Alcohol use: No    Alcohol/week: 0.0 oz  . Drug use: No  . Sexual activity: Not on file  Other Topics Concern  . Not on file  Social History Narrative  . Not on file    Family History  Problem Relation Age of Onset  . Heart attack Father        died  . Heart attack Mother 76  died  . Aneurysm Brother        AAA    ROS: no fevers or chills, productive cough, hemoptysis, dysphasia, odynophagia, melena, hematochezia, dysuria, hematuria, rash, seizure activity, orthopnea, PND, pedal edema, claudication. Remaining systems are negative.  Physical Exam: Well-developed well-nourished in no acute distress.  Skin is warm and dry.  HEENT is normal.  Neck is supple.  Chest is clear to auscultation with normal expansion.  Cardiovascular exam is regular rate and rhythm.  Abdominal exam nontender or distended. No masses palpated. Extremities show no edema. neuro grossly intact  ECG- normal sinus rhythm at a rate of 81. No ST changes. personally reviewed  A/P  1 coronary artery disease-patient doing well with no chest pain. Continue medical therapy with aspirin and statin.  2 bicuspid aortic valve-most recent echocardiogram shows no  significant aortic stenosis/aortic insufficiency and aortic root not dilated. Repeat study one year.  3 hypertension-blood pressure is controlled. Continue present medications. Check K and renal function.  4 hyperlipidemia-continue statin. Check lipids and liver.  Olga Millers, MD

## 2017-02-11 ENCOUNTER — Encounter (INDEPENDENT_AMBULATORY_CARE_PROVIDER_SITE_OTHER): Payer: Self-pay

## 2017-02-11 ENCOUNTER — Ambulatory Visit (INDEPENDENT_AMBULATORY_CARE_PROVIDER_SITE_OTHER): Payer: BLUE CROSS/BLUE SHIELD | Admitting: Cardiology

## 2017-02-11 ENCOUNTER — Encounter: Payer: Self-pay | Admitting: Cardiology

## 2017-02-11 VITALS — BP 118/70 | HR 81 | Ht 62.5 in | Wt 138.0 lb

## 2017-02-11 DIAGNOSIS — I2510 Atherosclerotic heart disease of native coronary artery without angina pectoris: Secondary | ICD-10-CM | POA: Diagnosis not present

## 2017-02-11 DIAGNOSIS — I10 Essential (primary) hypertension: Secondary | ICD-10-CM

## 2017-02-11 DIAGNOSIS — Q231 Congenital insufficiency of aortic valve: Secondary | ICD-10-CM | POA: Diagnosis not present

## 2017-02-11 DIAGNOSIS — E7800 Pure hypercholesterolemia, unspecified: Secondary | ICD-10-CM

## 2017-02-11 NOTE — Patient Instructions (Signed)
Medication Instructions:   NO CHANGE  Labwork:  Your physician recommends that you return for lab work PRIOR TO EATING  Follow-Up:  Your physician wants you to follow-up in: ONE YEAR WITH DR CRENSHAW You will receive a reminder letter in the mail two months in advance. If you don't receive a letter, please call our office to schedule the follow-up appointment.   If you need a refill on your cardiac medications before your next appointment, please call your pharmacy.    

## 2017-02-18 LAB — LIPID PANEL
Chol/HDL Ratio: 3.8 ratio (ref 0.0–5.0)
Cholesterol, Total: 125 mg/dL (ref 100–199)
HDL: 33 mg/dL — ABNORMAL LOW (ref >39)
LDL Calculated: 77 mg/dL (ref 0–99)
Triglycerides: 74 mg/dL (ref 0–149)
VLDL Cholesterol Cal: 15 mg/dL (ref 5–40)

## 2017-02-18 LAB — COMPREHENSIVE METABOLIC PANEL
ALBUMIN: 4.6 g/dL (ref 3.6–4.8)
ALT: 29 IU/L (ref 0–44)
AST: 24 IU/L (ref 0–40)
Albumin/Globulin Ratio: 1.8 (ref 1.2–2.2)
Alkaline Phosphatase: 106 IU/L (ref 39–117)
BUN / CREAT RATIO: 36 — AB (ref 10–24)
BUN: 20 mg/dL (ref 8–27)
Bilirubin Total: 0.5 mg/dL (ref 0.0–1.2)
CALCIUM: 9.7 mg/dL (ref 8.6–10.2)
CO2: 27 mmol/L (ref 20–29)
CREATININE: 0.56 mg/dL — AB (ref 0.76–1.27)
Chloride: 97 mmol/L (ref 96–106)
GFR calc non Af Amer: 111 mL/min/{1.73_m2} (ref >59)
GFR, EST AFRICAN AMERICAN: 128 mL/min/{1.73_m2} (ref >59)
GLUCOSE: 107 mg/dL — AB (ref 65–99)
Globulin, Total: 2.5 g/dL (ref 1.5–4.5)
Potassium: 4.3 mmol/L (ref 3.5–5.2)
Sodium: 139 mmol/L (ref 134–144)
TOTAL PROTEIN: 7.1 g/dL (ref 6.0–8.5)

## 2017-02-19 ENCOUNTER — Encounter: Payer: Self-pay | Admitting: *Deleted

## 2017-03-07 ENCOUNTER — Other Ambulatory Visit: Payer: Self-pay | Admitting: Cardiology

## 2017-03-07 NOTE — Telephone Encounter (Signed)
REFILL 

## 2017-04-02 ENCOUNTER — Other Ambulatory Visit: Payer: Self-pay | Admitting: Cardiology

## 2017-05-02 ENCOUNTER — Other Ambulatory Visit: Payer: Self-pay | Admitting: Cardiology

## 2017-05-02 NOTE — Telephone Encounter (Signed)
Rx has been sent to the pharmacy electronically. ° °

## 2017-06-15 ENCOUNTER — Other Ambulatory Visit: Payer: Self-pay | Admitting: Cardiology

## 2017-06-16 NOTE — Telephone Encounter (Signed)
Rx has been sent to the pharmacy electronically. ° °

## 2018-01-26 ENCOUNTER — Telehealth: Payer: Self-pay | Admitting: Cardiology

## 2018-01-28 NOTE — Telephone Encounter (Signed)
New Message        Patient's sister Alona Bene) called to let us know that patient passed away.

## 2018-01-28 NOTE — Telephone Encounter (Signed)
Routed to to [place], MD and Deliah Goody, RN

## 2018-01-28 NOTE — Telephone Encounter (Signed)
Left message for patients dtr to call if there was anything they needed. Condolences given.

## 2018-01-28 DEATH — deceased

## 2018-02-16 ENCOUNTER — Ambulatory Visit: Payer: BLUE CROSS/BLUE SHIELD | Admitting: Cardiology

## 2018-05-19 ENCOUNTER — Telehealth: Payer: Self-pay | Admitting: *Deleted

## 2018-05-19 NOTE — Telephone Encounter (Signed)
05/19/18 Mr.Szymborski's number is no longer in service,Left a message with Mrs.Dogett to have him give [place] a call.
